# Patient Record
Sex: Female | Born: 1965 | Race: White | Hispanic: No | Marital: Married | Smoking: Never smoker
Health system: Southern US, Community
[De-identification: ages and names within clinical notes are randomized; demographics above are authoritative.]

## PROBLEM LIST (undated history)

## (undated) DIAGNOSIS — Z86711 Personal history of pulmonary embolism: Secondary | ICD-10-CM

## (undated) HISTORY — PX: TUBAL LIGATION: SHX77

## (undated) HISTORY — DX: Personal history of pulmonary embolism: Z86.711

---

## 1998-08-28 ENCOUNTER — Other Ambulatory Visit: Admission: RE | Admit: 1998-08-28 | Discharge: 1998-08-28 | Payer: Self-pay | Admitting: Obstetrics and Gynecology

## 1999-05-27 ENCOUNTER — Inpatient Hospital Stay (HOSPITAL_COMMUNITY): Admission: AD | Admit: 1999-05-27 | Discharge: 1999-05-29 | Payer: Self-pay | Admitting: Obstetrics and Gynecology

## 1999-06-24 ENCOUNTER — Other Ambulatory Visit: Admission: RE | Admit: 1999-06-24 | Discharge: 1999-06-24 | Payer: Self-pay | Admitting: Obstetrics and Gynecology

## 1999-11-05 ENCOUNTER — Encounter: Admission: RE | Admit: 1999-11-05 | Discharge: 2000-02-03 | Payer: Self-pay | Admitting: *Deleted

## 2000-02-04 ENCOUNTER — Encounter: Admission: RE | Admit: 2000-02-04 | Discharge: 2000-05-04 | Payer: Self-pay | Admitting: Obstetrics and Gynecology

## 2000-07-19 ENCOUNTER — Other Ambulatory Visit: Admission: RE | Admit: 2000-07-19 | Discharge: 2000-07-19 | Payer: Self-pay | Admitting: Obstetrics and Gynecology

## 2001-03-02 ENCOUNTER — Inpatient Hospital Stay (HOSPITAL_COMMUNITY): Admission: EM | Admit: 2001-03-02 | Discharge: 2001-03-06 | Payer: Self-pay

## 2001-03-02 ENCOUNTER — Encounter: Payer: Self-pay | Admitting: Internal Medicine

## 2001-03-02 ENCOUNTER — Encounter: Admission: RE | Admit: 2001-03-02 | Discharge: 2001-03-02 | Payer: Self-pay | Admitting: Internal Medicine

## 2001-09-18 ENCOUNTER — Other Ambulatory Visit: Admission: RE | Admit: 2001-09-18 | Discharge: 2001-09-18 | Payer: Self-pay | Admitting: Obstetrics and Gynecology

## 2002-05-21 ENCOUNTER — Other Ambulatory Visit: Admission: RE | Admit: 2002-05-21 | Discharge: 2002-05-21 | Payer: Self-pay | Admitting: Obstetrics and Gynecology

## 2002-12-07 ENCOUNTER — Inpatient Hospital Stay (HOSPITAL_COMMUNITY): Admission: AD | Admit: 2002-12-07 | Discharge: 2002-12-10 | Payer: Self-pay | Admitting: Obstetrics and Gynecology

## 2002-12-07 ENCOUNTER — Encounter (INDEPENDENT_AMBULATORY_CARE_PROVIDER_SITE_OTHER): Payer: Self-pay

## 2003-01-02 ENCOUNTER — Other Ambulatory Visit: Admission: RE | Admit: 2003-01-02 | Discharge: 2003-01-02 | Payer: Self-pay | Admitting: Obstetrics and Gynecology

## 2004-05-29 ENCOUNTER — Ambulatory Visit: Payer: Self-pay | Admitting: Oncology

## 2004-07-27 ENCOUNTER — Ambulatory Visit: Payer: Self-pay | Admitting: Oncology

## 2004-09-21 ENCOUNTER — Ambulatory Visit: Payer: Self-pay | Admitting: Oncology

## 2004-11-16 ENCOUNTER — Ambulatory Visit: Payer: Self-pay | Admitting: Oncology

## 2005-01-18 ENCOUNTER — Ambulatory Visit: Payer: Self-pay | Admitting: Oncology

## 2005-03-12 ENCOUNTER — Ambulatory Visit: Payer: Self-pay | Admitting: Oncology

## 2005-05-17 ENCOUNTER — Ambulatory Visit: Payer: Self-pay | Admitting: Oncology

## 2005-07-23 ENCOUNTER — Ambulatory Visit: Payer: Self-pay | Admitting: Oncology

## 2005-09-22 ENCOUNTER — Ambulatory Visit: Payer: Self-pay | Admitting: Oncology

## 2005-10-28 LAB — PROTIME-INR
INR: 2.2 (ref 2.00–3.50)
Protime: 18.1 Seconds — ABNORMAL HIGH (ref 10.6–13.4)

## 2005-11-23 ENCOUNTER — Ambulatory Visit: Payer: Self-pay | Admitting: Oncology

## 2005-12-29 ENCOUNTER — Ambulatory Visit: Payer: Self-pay | Admitting: Oncology

## 2006-01-25 LAB — LUPUS ANTICOAGULANT PANEL
PTTLA 4:1 Mix: 45.9 secs — ABNORMAL HIGH (ref 30.5–43.1)
PTTLA Confirmation: 1.6 secs (ref <8.0)

## 2006-01-25 LAB — BETA-2 GLYCOPROTEIN ANTIBODIES
Beta-2 Glyco I IgG: <4 U/mL (ref <20)
Beta-2-Glycoprotein I IgM: <4 U/mL (ref <10)

## 2006-01-25 LAB — CARDIOLIPIN ANTIBODIES, IGG, IGM, IGA
Anticardiolipin IgG: <7 [GPL'U] (ref <11)
Anticardiolipin IgM: 34 [MPL'U] (ref <10)

## 2006-01-31 LAB — PROTIME-INR
INR: 1.5 — ABNORMAL LOW (ref 2.00–3.50)
Protime: 15.2 Seconds — ABNORMAL HIGH (ref 10.6–13.4)

## 2006-02-07 LAB — PROTIME-INR
INR: 2 (ref 2.00–3.50)
Protime: 17.5 Seconds — ABNORMAL HIGH (ref 10.6–13.4)

## 2006-02-17 ENCOUNTER — Ambulatory Visit: Payer: Self-pay | Admitting: Oncology

## 2006-02-21 LAB — PROTIME-INR: INR: 1.6 — ABNORMAL LOW (ref 2.00–3.50)

## 2006-03-15 LAB — PROTIME-INR: Protime: 25.2 Seconds — ABNORMAL HIGH (ref 10.6–13.4)

## 2006-04-25 ENCOUNTER — Ambulatory Visit: Payer: Self-pay | Admitting: Oncology

## 2006-04-27 LAB — PROTIME-INR: INR: 1.9 — ABNORMAL LOW (ref 2.00–3.50)

## 2006-06-13 ENCOUNTER — Ambulatory Visit: Payer: Self-pay | Admitting: Oncology

## 2006-06-15 LAB — PROTIME-INR
INR: 1.5 — ABNORMAL LOW (ref 2.00–3.50)
Protime: 18 Seconds — ABNORMAL HIGH (ref 10.6–13.4)

## 2006-06-22 LAB — PROTIME-INR
INR: 1.7 — ABNORMAL LOW (ref 2.00–3.50)
Protime: 20.4 Seconds — ABNORMAL HIGH (ref 10.6–13.4)

## 2006-07-25 LAB — CBC WITH DIFFERENTIAL/PLATELET
BASO%: 1.3 % (ref 0.0–2.0)
Basophils Absolute: 0.1 10*3/uL (ref 0.0–0.1)
EOS%: 2.4 % (ref 0.0–7.0)
HCT: 42.1 % (ref 34.8–46.6)
HGB: 14.5 g/dL (ref 11.6–15.9)
LYMPH%: 26 % (ref 14.0–48.0)
MCH: 31.1 pg (ref 26.0–34.0)
MCHC: 34.3 g/dL (ref 32.0–36.0)
MCV: 90.7 fL (ref 81.0–101.0)
NEUT%: 64.2 % (ref 39.6–76.8)
Platelets: 368 10*3/uL (ref 145–400)
lymph#: 1.6 10*3/uL (ref 0.9–3.3)

## 2006-07-28 LAB — LUPUS ANTICOAGULANT PANEL
DRVVT 1:1 Mix: 40.5 secs (ref 31.9–44.2)
DRVVT: 74.9 secs — ABNORMAL HIGH (ref 31.9–44.2)
PTTLA 4:1 Mix: 59.9 secs — ABNORMAL HIGH (ref 36.3–48.8)
PTTLA Confirmation: 0 secs (ref <8.0)

## 2006-07-28 LAB — COMPREHENSIVE METABOLIC PANEL
ALT: 14 U/L (ref 0–35)
AST: 16 U/L (ref 0–37)
BUN: 7 mg/dL (ref 6–23)
Calcium: 8.8 mg/dL (ref 8.4–10.5)
Chloride: 104 mEq/L (ref 96–112)
Creatinine, Ser: 0.72 mg/dL (ref 0.40–1.20)
Total Bilirubin: 0.5 mg/dL (ref 0.3–1.2)

## 2006-07-28 LAB — BETA-2 GLYCOPROTEIN ANTIBODIES: Beta-2 Glyco I IgG: <4 U/mL (ref <20)

## 2006-07-28 LAB — LACTATE DEHYDROGENASE: LDH: 164 U/L (ref 94–250)

## 2006-07-28 LAB — CARDIOLIPIN ANTIBODIES, IGG, IGM, IGA
Anticardiolipin IgA: 7 [APL'U] (ref <13)
Anticardiolipin IgM: <7 [MPL'U] (ref <10)

## 2006-08-01 ENCOUNTER — Ambulatory Visit: Payer: Self-pay | Admitting: Oncology

## 2007-01-19 ENCOUNTER — Ambulatory Visit: Payer: Self-pay | Admitting: Oncology

## 2007-01-25 LAB — CBC WITH DIFFERENTIAL/PLATELET
EOS%: 3.4 % (ref 0.0–7.0)
LYMPH%: 27.9 % (ref 14.0–48.0)
MCH: 32.2 pg (ref 26.0–34.0)
MCHC: 35.5 g/dL (ref 32.0–36.0)
MCV: 90.7 fL (ref 81.0–101.0)
MONO%: 6.7 % (ref 0.0–13.0)
NEUT#: 3.6 10*3/uL (ref 1.5–6.5)
Platelets: 320 10*3/uL (ref 145–400)
RBC: 4.59 10*6/uL (ref 3.70–5.32)
RDW: 12.5 % (ref 11.3–14.5)

## 2007-01-27 LAB — PROTEIN S ACTIVITY: Protein S Activity: 103 % (ref 69–129)

## 2007-01-27 LAB — D-DIMER, QUANTITATIVE: D-Dimer, Quant: 0.57 ug/mL-FEU — ABNORMAL HIGH (ref 0.00–0.48)

## 2007-01-27 LAB — PROTEIN S, TOTAL: Protein S Ag, Total: 112 % (ref 70–140)

## 2007-07-26 ENCOUNTER — Ambulatory Visit: Payer: Self-pay | Admitting: Oncology

## 2007-07-31 LAB — CBC WITH DIFFERENTIAL/PLATELET
Basophils Absolute: 0 10*3/uL (ref 0.0–0.1)
EOS%: 3.4 % (ref 0.0–7.0)
HGB: 14.7 g/dL (ref 11.6–15.9)
LYMPH%: 27.5 % (ref 14.0–48.0)
MCH: 32.2 pg (ref 26.0–34.0)
MCV: 91.1 fL (ref 81.0–101.0)
MONO%: 5.6 % (ref 0.0–13.0)
Platelets: 361 10*3/uL (ref 145–400)
RDW: 12.6 % (ref 11.3–14.5)

## 2007-08-03 LAB — BETA-2 GLYCOPROTEIN ANTIBODIES: Beta-2 Glyco I IgG: 6 U/mL (ref <20)

## 2007-08-31 ENCOUNTER — Ambulatory Visit: Payer: Self-pay | Admitting: Surgery

## 2007-08-31 ENCOUNTER — Encounter: Payer: Self-pay | Admitting: Oncology

## 2007-08-31 ENCOUNTER — Ambulatory Visit: Admission: RE | Admit: 2007-08-31 | Discharge: 2007-08-31 | Payer: Self-pay | Admitting: Oncology

## 2007-08-31 LAB — CBC WITH DIFFERENTIAL/PLATELET
BASO%: 0.3 % (ref 0.0–2.0)
EOS%: 3.3 % (ref 0.0–7.0)
HCT: 40.9 % (ref 34.8–46.6)
HGB: 14.5 g/dL (ref 11.6–15.9)
MCH: 32.3 pg (ref 26.0–34.0)
MCHC: 35.4 g/dL (ref 32.0–36.0)
MONO#: 0.4 10*3/uL (ref 0.1–0.9)
RDW: 12.2 % (ref 11.3–14.5)
WBC: 5.4 10*3/uL (ref 3.9–10.0)
lymph#: 1.6 10*3/uL (ref 0.9–3.3)

## 2007-08-31 LAB — PROTIME-INR: Protime: 14.4 Seconds — ABNORMAL HIGH (ref 10.6–13.4)

## 2007-09-01 LAB — D-DIMER, QUANTITATIVE: D-Dimer, Quant: 1.73 ug/mL-FEU — ABNORMAL HIGH (ref 0.00–0.48)

## 2007-09-07 ENCOUNTER — Ambulatory Visit: Payer: Self-pay | Admitting: Oncology

## 2007-09-12 LAB — PROTIME-INR

## 2007-09-14 LAB — PROTIME-INR
INR: 2.8 (ref 2.00–3.50)
Protime: 33.6 Seconds — ABNORMAL HIGH (ref 10.6–13.4)

## 2007-09-19 LAB — PROTIME-INR
INR: 1.7 — ABNORMAL LOW (ref 2.00–3.50)
Protime: 20.4 Seconds — ABNORMAL HIGH (ref 10.6–13.4)

## 2007-09-26 LAB — PROTIME-INR: INR: 1.9 — ABNORMAL LOW (ref 2.00–3.50)

## 2007-10-10 LAB — PROTIME-INR: INR: 2.9 (ref 2.00–3.50)

## 2007-10-19 ENCOUNTER — Ambulatory Visit: Payer: Self-pay | Admitting: Oncology

## 2007-10-24 LAB — PROTIME-INR: Protime: 37.2 Seconds — ABNORMAL HIGH (ref 10.6–13.4)

## 2007-11-14 LAB — PROTIME-INR: INR: 2 (ref 2.00–3.50)

## 2007-12-07 ENCOUNTER — Ambulatory Visit: Payer: Self-pay | Admitting: Oncology

## 2007-12-12 LAB — PROTIME-INR
INR: 1.6 — ABNORMAL LOW (ref 2.00–3.50)
Protime: 19.2 Seconds — ABNORMAL HIGH (ref 10.6–13.4)

## 2007-12-27 LAB — PROTIME-INR: INR: 2.7 (ref 2.00–3.50)

## 2008-01-24 ENCOUNTER — Ambulatory Visit: Payer: Self-pay | Admitting: Oncology

## 2008-01-29 LAB — CBC WITH DIFFERENTIAL/PLATELET
BASO%: 0.7 % (ref 0.0–2.0)
LYMPH%: 25.9 % (ref 14.0–48.0)
MCHC: 35 g/dL (ref 32.0–36.0)
MONO#: 0.5 10*3/uL (ref 0.1–0.9)
MONO%: 8 % (ref 0.0–13.0)
Platelets: 301 10*3/uL (ref 145–400)
RBC: 4.43 10*6/uL (ref 3.70–5.32)
RDW: 12.5 % (ref 11.3–14.5)
WBC: 5.7 10*3/uL (ref 3.9–10.0)

## 2008-01-29 LAB — D-DIMER, QUANTITATIVE: D-Dimer, Quant: 0.22 ug/mL-FEU (ref 0.00–0.48)

## 2008-01-29 LAB — ERYTHROCYTE SEDIMENTATION RATE: Sed Rate: 21 mm/hr (ref 0–30)

## 2008-04-12 ENCOUNTER — Ambulatory Visit: Payer: Self-pay | Admitting: Oncology

## 2008-04-16 LAB — CBC WITH DIFFERENTIAL/PLATELET
BASO%: 1.8 % (ref 0.0–2.0)
LYMPH%: 27.7 % (ref 14.0–48.0)
MCHC: 34.7 g/dL (ref 32.0–36.0)
MONO#: 0.4 10*3/uL (ref 0.1–0.9)
NEUT#: 3.1 10*3/uL (ref 1.5–6.5)
Platelets: 319 10*3/uL (ref 145–400)
RBC: 4.7 10*6/uL (ref 3.70–5.32)
RDW: 12 % (ref 11.3–14.5)
WBC: 5.1 10*3/uL (ref 3.9–10.0)
lymph#: 1.4 10*3/uL (ref 0.9–3.3)

## 2008-04-16 LAB — PROTIME-INR
INR: 2.3 (ref 2.00–3.50)
Protime: 27.6 Seconds — ABNORMAL HIGH (ref 10.6–13.4)

## 2008-06-07 ENCOUNTER — Ambulatory Visit: Payer: Self-pay | Admitting: Oncology

## 2008-06-11 LAB — PROTIME-INR
INR: 3.2 (ref 2.00–3.50)
Protime: 38.4 Seconds — ABNORMAL HIGH (ref 10.6–13.4)

## 2008-08-02 ENCOUNTER — Ambulatory Visit: Payer: Self-pay | Admitting: Oncology

## 2008-09-27 ENCOUNTER — Ambulatory Visit: Payer: Self-pay | Admitting: Oncology

## 2008-10-01 LAB — PROTIME-INR

## 2008-10-07 ENCOUNTER — Encounter: Admission: RE | Admit: 2008-10-07 | Discharge: 2008-10-07 | Payer: Self-pay | Admitting: Obstetrics and Gynecology

## 2008-10-28 ENCOUNTER — Encounter: Payer: Self-pay | Admitting: Oncology

## 2008-10-28 ENCOUNTER — Ambulatory Visit: Admission: RE | Admit: 2008-10-28 | Discharge: 2008-10-28 | Payer: Self-pay | Admitting: Oncology

## 2008-10-28 ENCOUNTER — Ambulatory Visit: Payer: Self-pay | Admitting: *Deleted

## 2008-10-28 LAB — CBC WITH DIFFERENTIAL/PLATELET
BASO%: 0.6 % (ref 0.0–2.0)
Basophils Absolute: 0 10*3/uL (ref 0.0–0.1)
EOS%: 1.8 % (ref 0.0–7.0)
HGB: 13.6 g/dL (ref 11.6–15.9)
MCH: 32 pg (ref 25.1–34.0)
MCV: 91.7 fL (ref 79.5–101.0)
MONO%: 7.6 % (ref 0.0–14.0)
RBC: 4.25 10*6/uL (ref 3.70–5.45)
RDW: 13 % (ref 11.2–14.5)
lymph#: 1.7 10*3/uL (ref 0.9–3.3)

## 2008-10-28 LAB — PROTIME-INR
INR: 2.9 (ref 2.00–3.50)
Protime: 34.8 Seconds — ABNORMAL HIGH (ref 10.6–13.4)

## 2008-12-19 ENCOUNTER — Ambulatory Visit: Payer: Self-pay | Admitting: Oncology

## 2008-12-23 LAB — PROTIME-INR
INR: 2.8 (ref 2.00–3.50)
Protime: 33.6 Seconds — ABNORMAL HIGH (ref 10.6–13.4)

## 2009-01-21 ENCOUNTER — Ambulatory Visit: Payer: Self-pay | Admitting: Oncology

## 2009-02-17 LAB — PROTIME-INR
INR: 2.3 (ref 2.00–3.50)
Protime: 27.6 Seconds — ABNORMAL HIGH (ref 10.6–13.4)

## 2009-04-10 ENCOUNTER — Ambulatory Visit: Payer: Self-pay | Admitting: Oncology

## 2009-04-14 LAB — PROTIME-INR
INR: 2.2 (ref 2.00–3.50)
Protime: 26.4 Seconds — ABNORMAL HIGH (ref 10.6–13.4)

## 2009-06-06 ENCOUNTER — Ambulatory Visit: Payer: Self-pay | Admitting: Oncology

## 2009-06-10 LAB — CBC WITH DIFFERENTIAL/PLATELET
BASO%: 0.4 % (ref 0.0–2.0)
Eosinophils Absolute: 0.1 10*3/uL (ref 0.0–0.5)
MCHC: 34.5 g/dL (ref 31.5–36.0)
MONO#: 0.4 10*3/uL (ref 0.1–0.9)
NEUT#: 4.8 10*3/uL (ref 1.5–6.5)
RBC: 4.64 10*6/uL (ref 3.70–5.45)
WBC: 6.8 10*3/uL (ref 3.9–10.3)
lymph#: 1.4 10*3/uL (ref 0.9–3.3)

## 2009-06-10 LAB — PROTIME-INR: Protime: 42 Seconds — ABNORMAL HIGH (ref 10.6–13.4)

## 2009-08-01 ENCOUNTER — Ambulatory Visit: Payer: Self-pay | Admitting: Oncology

## 2009-08-05 LAB — PROTIME-INR: Protime: 18 Seconds — ABNORMAL HIGH (ref 10.6–13.4)

## 2009-10-02 ENCOUNTER — Ambulatory Visit: Payer: Self-pay | Admitting: Oncology

## 2009-10-08 LAB — CBC WITH DIFFERENTIAL/PLATELET
Basophils Absolute: 0 10*3/uL (ref 0.0–0.1)
EOS%: 4.8 % (ref 0.0–7.0)
HGB: 15 g/dL (ref 11.6–15.9)
LYMPH%: 30.3 % (ref 14.0–49.7)
MCH: 31.8 pg (ref 25.1–34.0)
MCV: 91.1 fL (ref 79.5–101.0)
MONO%: 11.8 % (ref 0.0–14.0)
RDW: 13.2 % (ref 11.2–14.5)

## 2009-10-08 LAB — PROTIME-INR
INR: 2.5 (ref 2.00–3.50)
Protime: 30 Seconds — ABNORMAL HIGH (ref 10.6–13.4)

## 2009-11-19 ENCOUNTER — Ambulatory Visit: Payer: Self-pay | Admitting: Oncology

## 2009-11-19 LAB — CBC WITH DIFFERENTIAL/PLATELET
BASO%: 0.3 % (ref 0.0–2.0)
EOS%: 2.7 % (ref 0.0–7.0)
MCH: 31.8 pg (ref 25.1–34.0)
MCHC: 34.5 g/dL (ref 31.5–36.0)
NEUT%: 74.6 % (ref 38.4–76.8)
RDW: 13.2 % (ref 11.2–14.5)
lymph#: 1.2 10*3/uL (ref 0.9–3.3)

## 2009-11-19 LAB — PROTIME-INR
INR: 3.1 (ref 2.00–3.50)
Protime: 37.2 Seconds — ABNORMAL HIGH (ref 10.6–13.4)

## 2009-12-10 ENCOUNTER — Ambulatory Visit: Payer: Self-pay | Admitting: Family Medicine

## 2009-12-10 DIAGNOSIS — L259 Unspecified contact dermatitis, unspecified cause: Secondary | ICD-10-CM

## 2010-01-09 ENCOUNTER — Ambulatory Visit: Payer: Self-pay | Admitting: Oncology

## 2010-01-15 ENCOUNTER — Ambulatory Visit: Payer: Self-pay | Admitting: Family

## 2010-01-15 ENCOUNTER — Telehealth: Payer: Self-pay | Admitting: Family

## 2010-01-15 ENCOUNTER — Ambulatory Visit (HOSPITAL_BASED_OUTPATIENT_CLINIC_OR_DEPARTMENT_OTHER): Admission: RE | Admit: 2010-01-15 | Discharge: 2010-01-15 | Payer: Self-pay | Admitting: Internal Medicine

## 2010-01-15 ENCOUNTER — Ambulatory Visit: Payer: Self-pay | Admitting: Diagnostic Radiology

## 2010-01-15 LAB — CONVERTED CEMR LAB
INR: 2.81 — ABNORMAL HIGH (ref <1.50)
Prothrombin Time: 29.4 s — ABNORMAL HIGH (ref 11.6–15.2)

## 2010-01-15 LAB — PROTIME-INR: INR: 3.1 (ref 2.00–3.50)

## 2010-01-26 ENCOUNTER — Ambulatory Visit: Payer: Self-pay | Admitting: Family

## 2010-01-26 DIAGNOSIS — D689 Coagulation defect, unspecified: Secondary | ICD-10-CM | POA: Insufficient documentation

## 2010-01-26 DIAGNOSIS — R609 Edema, unspecified: Secondary | ICD-10-CM | POA: Insufficient documentation

## 2010-01-26 LAB — CONVERTED CEMR LAB
INR: 1.69 — ABNORMAL HIGH (ref <1.50)
Prothrombin Time: 19.7 s — ABNORMAL HIGH (ref 11.6–15.2)

## 2010-01-29 ENCOUNTER — Telehealth: Payer: Self-pay | Admitting: Family

## 2010-01-30 ENCOUNTER — Ambulatory Visit (HOSPITAL_BASED_OUTPATIENT_CLINIC_OR_DEPARTMENT_OTHER): Admission: RE | Admit: 2010-01-30 | Discharge: 2010-01-30 | Payer: Self-pay | Admitting: Internal Medicine

## 2010-01-30 ENCOUNTER — Telehealth: Payer: Self-pay | Admitting: Internal Medicine

## 2010-01-30 ENCOUNTER — Ambulatory Visit: Payer: Self-pay | Admitting: Diagnostic Radiology

## 2010-02-02 LAB — CBC WITH DIFFERENTIAL/PLATELET
Basophils Absolute: 0.1 10*3/uL (ref 0.0–0.1)
Eosinophils Absolute: 0.4 10*3/uL (ref 0.0–0.5)
HGB: 14.3 g/dL (ref 11.6–15.9)
MCV: 90.8 fL (ref 79.5–101.0)
NEUT#: 3.1 10*3/uL (ref 1.5–6.5)
RDW: 13.1 % (ref 11.2–14.5)
lymph#: 1.6 10*3/uL (ref 0.9–3.3)

## 2010-02-02 LAB — PROTIME-INR: INR: 3.5 (ref 2.00–3.50)

## 2010-02-27 ENCOUNTER — Ambulatory Visit: Payer: Self-pay | Admitting: Oncology

## 2010-03-03 LAB — PROTIME-INR: Protime: 32.4 Seconds — ABNORMAL HIGH (ref 10.6–13.4)

## 2010-03-06 ENCOUNTER — Ambulatory Visit: Payer: Self-pay | Admitting: Family

## 2010-04-30 ENCOUNTER — Ambulatory Visit: Payer: Self-pay | Admitting: Oncology

## 2010-05-19 LAB — PROTIME-INR
INR: 2.4 (ref 2.00–3.50)
Protime: 28.8 Seconds — ABNORMAL HIGH (ref 10.6–13.4)

## 2010-06-01 ENCOUNTER — Telehealth: Payer: Self-pay | Admitting: Family

## 2010-06-01 ENCOUNTER — Encounter: Payer: Self-pay | Admitting: Family

## 2010-06-01 LAB — CONVERTED CEMR LAB
INR: 3.69 — ABNORMAL HIGH (ref <1.50)
Prothrombin Time: 36.6 s — ABNORMAL HIGH (ref 11.6–15.2)

## 2010-06-02 ENCOUNTER — Telehealth: Payer: Self-pay | Admitting: Family

## 2010-06-09 LAB — CONVERTED CEMR LAB: INR: 2.2 — ABNORMAL HIGH (ref <1.50)

## 2010-07-08 ENCOUNTER — Ambulatory Visit (HOSPITAL_BASED_OUTPATIENT_CLINIC_OR_DEPARTMENT_OTHER): Payer: PRIVATE HEALTH INSURANCE | Admitting: Oncology

## 2010-07-16 LAB — PROTIME-INR
INR: 2.2 (ref 2.00–3.50)
Protime: 26.4 Seconds — ABNORMAL HIGH (ref 10.6–13.4)

## 2010-08-13 NOTE — Assessment & Plan Note (Signed)
Summary: Rash - L lower leg, swelling x 2 dys rm 2   Vital Signs:  Patient Profile:   45 Years Old Female CC:      Rash x 2 dys  Height:     67 inches Weight:      187 pounds O2 Sat:      99 % O2 treatment:    Room Air Temp:     97.3 degrees F oral Pulse rate:   56 / minute Pulse rhythm:   regular Resp:     16 per minute BP sitting:   129 / 80  (right arm) Cuff size:   regular  Vitals Entered By: Areta Haber CMA (December 10, 2009 7:44 PM)                  Prior Medication List:  No prior medications documented  Current Allergies: No known allergies History of Present Illness Chief Complaint: Rash x 2 dys  History of Present Illness: Patient has rash on her lower L leg. The rash came about a month ago . She was put on prednisone for about a week. The rah did well initially and then it came back.   There ios no sweling in her arm but her L leg started swelling yestrday. She has seen her Oncologist in May and has had her PT checked then.  She states emphatically that this does not ffel like when she had blood clot in her leg.   Current Problems: CELLULITIS, LEFT LEG (ICD-682.6) CONTACT DERMATITIS&OTHER ECZEMA DUE UNSPEC CAUSE (ICD-692.9)   Current Meds COUMADIN 7.5 MG TABS (WARFARIN SODIUM) 1 tab by mouth once daily SEPTRA DS 800-160 MG TABS (SULFAMETHOXAZOLE-TRIMETHOPRIM) 1 by mouth twice daily BACTROBAN 2 % OINT (MUPIROCIN) apply to leg 3x a dy for 10 days PREDNISONE (PAK) 10 MG TABS (PREDNISONE) sig  6 pills by mouth day 1&2, 5 pills by mouth day 3&4, 4 pills day 5&6, 3 pills by mouth day 7&8, 2 pills by mouth day 9 &10, 1 pill by mouth day 11&12  REVIEW OF SYSTEMS Constitutional Symptoms      Denies fever, chills, night sweats, weight loss, weight gain, and fatigue.  Eyes       Denies change in vision, eye pain, eye discharge, glasses, contact lenses, and eye surgery. Ear/Nose/Throat/Mouth       Denies hearing loss/aids, change in hearing, ear pain, ear  discharge, dizziness, frequent runny nose, frequent nose bleeds, sinus problems, sore throat, hoarseness, and tooth pain or bleeding.  Respiratory       Denies dry cough, productive cough, wheezing, shortness of breath, asthma, bronchitis, and emphysema/COPD.  Cardiovascular       Denies murmurs, chest pain, and tires easily with exhertion.    Gastrointestinal       Denies stomach pain, nausea/vomiting, diarrhea, constipation, blood in bowel movements, and indigestion. Genitourniary       Denies painful urination, kidney stones, and loss of urinary control. Neurological       Denies paralysis, seizures, and fainting/blackouts. Musculoskeletal       Complains of swelling.      Denies muscle pain, joint pain, joint stiffness, decreased range of motion, redness, muscle weakness, and gout.  Skin       Denies bruising, unusual mles/lumps or sores, and hair/skin or nail changes.      Comments: L lower leg x 2 dys Psych       Denies mood changes, temper/anger issues, anxiety/stress, speech problems, depression, and sleep  problems. Other Comments: arms and leg itch   Past History:  Family History: Last updated: 12/10/2009 Family History High cholesterol  Social History: Last updated: 12/10/2009 Married Never Smoked Alcohol use-yes - 4 drinks weekly Drug use-no Regular exercise-no  Risk Factors: Exercise: no (12/10/2009)  Risk Factors: Smoking Status: never (12/10/2009)  Past Medical History: Antiphospholid syndrome Blood CLot  Past Surgical History: Caesarean section Bunion removal  Family History: Reviewed history and no changes required. Family History High cholesterol  Social History: Reviewed history and no changes required. Married Never Smoked Alcohol use-yes - 4 drinks weekly Drug use-no Regular exercise-no Smoking Status:  never Drug Use:  no Does Patient Exercise:  no Physical Exam General appearance: well developed, well nourished, mild   distress Head: normocephalic, atraumatic Extremities: Rash w/ marcked exoriations on both arms and L leg  Skin: non specific rash on arms  MSE: oriented to time, place, and person Assessment New Problems: CELLULITIS, LEFT LEG (ICD-682.6) CONTACT DERMATITIS&OTHER ECZEMA DUE UNSPEC CAUSE (ICD-692.9)  cellulitis  and  edema contact dermatitis  Patient Education: Patient and/or caregiver instructed in the following: rest fluids and Tylenol.  Plan New Medications/Changes: PREDNISONE (PAK) 10 MG TABS (PREDNISONE) sig  6 pills by mouth day 1&2, 5 pills by mouth day 3&4, 4 pills day 5&6, 3 pills by mouth day 7&8, 2 pills by mouth day 9 &10, 1 pill by mouth day 11&12  #42 x 0, 12/10/2009, Hassan Rowan MD BACTROBAN 2 % OINT (MUPIROCIN) apply to leg 3x a dy for 10 days  #1 tube x 0, 12/10/2009, Hassan Rowan MD SEPTRA DS 800-160 MG TABS (SULFAMETHOXAZOLE-TRIMETHOPRIM) 1 by mouth twice daily  #20 x 0, 12/10/2009, Hassan Rowan MD  New Orders: New Patient Level IV [56213] Solumedrol up to 125mg  [J2930] Rocephin  250mg  [J0696] Admin of Therapeutic Inj  intramuscular or subcutaneous [96372] Follow Up: Follow up in 2-3 days if no improvement, Follow up with Primary Physician Work/School Excuse: Return to work/school in 3 days  The patient and/or caregiver has been counseled thoroughly with regard to medications prescribed including dosage, schedule, interactions, rationale for use, and possible side effects and they verbalize understanding.  Diagnoses and expected course of recovery discussed and will return if not improved as expected or if the condition worsens. Patient and/or caregiver verbalized understanding.  Prescriptions: PREDNISONE (PAK) 10 MG TABS (PREDNISONE) sig  6 pills by mouth day 1&2, 5 pills by mouth day 3&4, 4 pills day 5&6, 3 pills by mouth day 7&8, 2 pills by mouth day 9 &10, 1 pill by mouth day 11&12  #42 x 0   Entered and Authorized by:   Hassan Rowan MD   Signed by:   Hassan Rowan  MD on 12/10/2009   Method used:   Print then Give to Patient   RxID:   [id number] BACTROBAN 2 % OINT (MUPIROCIN) apply to leg 3x a dy for 10 days  #1 tube x 0   Entered and Authorized by:   Hassan Rowan MD   Signed by:   Hassan Rowan MD on 12/10/2009   Method used:   Print then Give to Patient   RxID:   [id number] SEPTRA DS 800-160 MG TABS (SULFAMETHOXAZOLE-TRIMETHOPRIM) 1 by mouth twice daily  #20 x 0   Entered and Authorized by:   Hassan Rowan MD   Signed by:   Hassan Rowan MD on 12/10/2009   Method used:   Print then Give to Patient   RxID:   [id number]   Patient Instructions:  1)  Please schedule an appointment with your primary doctor in : 2)   follow-up appointment in 2-5 days 3)  Recommended remaining out of work for nxt 2 days 4)  Take your antibiotic as prescribed until ALL of it is gone, but stop if you develop a rash or swelling and contact our office as soon as possible. 5)  Claritin 10 mg 1 by mouth q day may take a zyrtec at night if needed 6)  Bed rest and elevation of L leg  7)  Follow up w/dermatologist is worse may need to considerpediculosis infection.  Medication Administration  Injection # 1:    Medication: Solumedrol up to 125mg     Diagnosis: CELLULITIS, LEFT LEG (ICD-682.6)    Route: IM    Site: LUOQ gluteus    Exp Date: 06/10/2012    Lot #: Marilynne Drivers    Mfr: Pharmacia    Comments: Administered 125mg     Patient tolerated injection without complications    Given by: Areta Haber CMA (December 10, 2009 8:50 PM)  Injection # 2:    Medication: Rocephin  250mg     Diagnosis: CELLULITIS, LEFT LEG (ICD-682.6)    Route: IM    Site: RUOQ gluteus    Exp Date: 07/11/2012    Lot #: [id number]    Mfr: Sandox    Comments: Administered 1 gram    Patient tolerated injection without complications    Given by: Areta Haber CMA (December 10, 2009 8:52 PM)  Orders Added: 1)  New Patient Level IV [99204] 2)  Solumedrol up to 125mg  [J2930] 3)  Rocephin   250mg  [J0696] 4)  Admin of Therapeutic Inj  intramuscular or subcutaneous [96372]   Medication Administration  Injection # 1:    Medication: Solumedrol up to 125mg     Diagnosis: CELLULITIS, LEFT LEG (ICD-682.6)    Route: IM    Site: LUOQ gluteus    Exp Date: 06/10/2012    Lot #: Marilynne Drivers    Mfr: Pharmacia    Comments: Administered 125mg     Patient tolerated injection without complications    Given by: Areta Haber CMA (December 10, 2009 8:50 PM)  Injection # 2:    Medication: Rocephin  250mg     Diagnosis: CELLULITIS, LEFT LEG (ICD-682.6)    Route: IM    Site: RUOQ gluteus    Exp Date: 07/11/2012    Lot #: [id number]    Mfr: Sandox    Comments: Administered 1 gram    Patient tolerated injection without complications    Given by: Areta Haber CMA (December 10, 2009 8:52 PM)  Orders Added: 1)  New Patient Level IV [99204] 2)  Solumedrol up to 125mg  [J2930] 3)  Rocephin  250mg  [J0696] 4)  Admin of Therapeutic Inj  intramuscular or subcutaneous [81191]

## 2010-08-13 NOTE — Progress Notes (Signed)
Summary: PT/INR-  [facility]  Phone Note From Other Clinic   Caller: [facility] Call For: Sarah Harrington  Summary of Call: patients pt is 29.4 and inr is 2.81  Initial call taken by: Roselle Locus,  January 15, 2010 1:36 PM  Follow-up for Phone Call        Norval Gable From [facility] called [phone]), and stated patient was seen and had her INR drawn there, to avoid this in the future, she states that patient had informed her that she is scheduled to follow up on the 18th of July for medication check.  Eileen Stanford was informed a follow up appointment was not on file for patient. She would like to know if the patient does return on the 18th if she could have her PT/INR drawn at her appointment and faxed to them when completed at [phone] Follow-up by: Glendell Docker CMA,  January 15, 2010 1:43 PM

## 2010-08-13 NOTE — Assessment & Plan Note (Signed)
Summary:  Rash on Left Lower Leg & Ankles are swollen- jr- Rm 4   Vital Signs:  Patient profile:   45 year old female Height:      67 inches Weight:      189.75 pounds BMI:     29.83 Temp:     98.7 degrees F oral Pulse rate:   62 / minute Pulse rhythm:   regular Resp:     18 per minute BP sitting:   112 / 80  (right arm) Cuff size:   large  Vitals Entered By: [name] CMA [name]) (January 15, 2010 9:57 AM) CC: Room 4  Rash & swelling on left lower leg.  Swelling is better with rest.  States she was seen by urgent care 1 month ago and treated with abx. Symptoms improved but have returned 2 weeks ago. Pt has history of blood clot but states that her PT/INR was normal 3 weeks ago. Is Patient Diabetic? No Comments Pt has completed Bactroban and Prednisone.   CC:  Room 4  Rash & swelling on left lower leg.  Swelling is better with rest.  States she was seen by urgent care 1 month ago and treated with abx. Symptoms improved but have returned 2 weeks ago. Pt has history of blood clot but states that her PT/INR was normal 3 weeks ago.[name]  History of Present Illness: Sarah Harrington is a 45 year old female who presents today with complaint of rash and swelling of the left leg which started on easter weekend.  She was treated with a predpeck with some improvement.  Then symptoms worsened and she was treated by the urgent care center again with antibiotics and another round of steroids.  Symptoms improved- then came back.    Patient with historty of LLE DVT and PE 2002.  She notes that she was briefly off of coumadin several years later,   and had recurrent DVT.  She has + history of antiphospholipid syndrome- Dr. Cyndie Chime manages coumadin.   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 Cyndie Chime  Past Surgical History: Caesarean section x 2 1997 and 2004 Bunion removal Adenoidectomy age 67  Review of Systems       Denies shortness of breath, fever of calf pain.   Physical Exam  General:  Well-developed,well-nourished,in no acute distress; alert,appropriate and cooperative throughout examination Head:  Normocephalic and atraumatic without obvious abnormalities. No apparent alopecia or balding. Lungs:  Normal respiratory effort, chest expands symmetrically. Lungs are clear to auscultation, no crackles or wheezes. Heart:  Normal rate and regular rhythm. S1 and S2 normal without gallop, murmur, click, rub or other extra sounds. Extremities:  +swelling of LLE up to knee, + erythema noted on left shin   Impression & Recommendations:  Problem # 1:  CELLULITIS, LEFT LEG (ICD-682.6) INR therapeutic, doppler negative for DVT.  Will treat with Keflex for cellulitis.  Recommended f/u on Monday of next week- pt tells me she will be out of town until the following Monday.  Instructed patient to seek medical attention if worsening pain, swelling, redness or if fever.  She verbalized understanding and plans  to f/u the first day upon her return.  We will recheck PT/INR that day. Orders: INR/PT-[facility] ([address]) Misc. Referral (Misc. Ref)  Her updated medication list for this problem includes:    Keflex 500 Mg Caps (Cephalexin) .[name]... 2 tabs by mouth two times a day x 7 days  Complete Medication List: 1)  Coumadin 7.5 Mg Tabs (Warfarin sodium) .[name].. 1 tab by mouth once daily 2)  Keflex 500 Mg Caps (Cephalexin) .... 2 tabs by mouth two times a day x 7 days  Patient Instructions: 1)  Please follow up as soon as you return from your vacation. 2)  If you  develop increased redness, swelling, pain or fever of the left leg while you are on vacation, please seek medical attention. Prescriptions: KEFLEX 500 MG CAPS (CEPHALEXIN) 2 tabs by mouth two times a day x 7 days  #14 x 0   Entered and Authorized by:   Lemont Fillers FNP   Signed by:   Lemont Fillers FNP on 01/15/2010   Method used:   Electronically to        [facility]  [address] [phone]* (retail)       [address]       [place], [place]  [address]       Ph: [phone]       Fax: [phone]   RxID:   [id number]   Current Allergies (reviewed today): No known allergies

## 2010-08-13 NOTE — Progress Notes (Signed)
  Phone Note Outgoing Call   Summary of Call: Called patient, reviewed INR 3.6, recommended that she hold coumadin x 2 nights, then resume current dose of 7.5mg  daily. F/u PT/INR on Monday11/21.  Pt reports that her throat is feeling much better today. Initial call taken by: Lemont Fillers FNP,  June 02, 2010 8:49 AM

## 2010-08-13 NOTE — Assessment & Plan Note (Signed)
Summary: 1 MONTH FOLLOW UP/[facility] per pt/dt--Rm 5   Vital Signs:  Patient profile:   45 year old female Height:      67 inches Weight:      188.50 pounds BMI:     29.63 Temp:     98.1 degrees F oral Pulse rate:   66 / minute Pulse rhythm:   regular Resp:     16 per minute BP sitting:   106 / 70  (right arm) Cuff size:   k  Vitals Entered By: [name] CMA [name]) (March 06, 2010 8:27 AM) CC: Room 5  1 month follow up. Is Patient Diabetic? No   CC:  Room 5  1 month follow up.[name]  History of Present Illness: Sarah Harrington is a 45 year old female who presents today for follow up of her LE swelling.  She has a history of LLE DVT.   She has had a LLE doppler whichwas negative for DVT as well as a CT abdomen and pelvis which was negative.  Last visit she was given and Rx for support hose.  Pt has been using support hose with some improvement in LLE swelling.  Notes that she still has a rash on her left shin.   Pt has been seeing dermatology who has prescribed a steroid cream and told her that the rash is likely due to dermatitis from chronic venous insufficiency.  Allergies (verified): No Known Drug Allergies  Past History:  Past Medical History: Last updated: 01/15/2010 Antiphospholid syndrome DVT x2 LLE (first in 2002) PE 2002- coumadin monitored by Dr. Cyndie Chime  Past Surgical History: Last updated: 01/15/2010 Caesarean section x 2 1997 and 2004 Bunion removal Adenoidectomy age 82  Physical Exam  General:  Well-developed,well-nourished,in no acute distress; alert,appropriate and cooperative throughout examination Lungs:  Normal respiratory effort, chest expands symmetrically. Lungs are clear to auscultation, no crackles or wheezes. Heart:  Normal rate and regular rhythm. S1 and S2 normal without gallop, murmur, click, rub or other extra sounds. Extremities:  + red rash noted on left anterior shin.  Mild swelling of LLE but improved since last  visit.   Impression & Recommendations:  Problem # 1:  EDEMA (ICD-782.3) Assessment Improved Continue support hose and fluocinonide as prescribed by dermatology.  Improving.  Plan for complete physical next visit.  Complete Medication List: 1)  Coumadin 7.5 Mg Tabs (Warfarin sodium) .[name].. 1 tab by mouth once daily 2)  Jobst Support Hose Thigh High 20-29mmhg  .... Wear as directed 3)  Aspirin 81 Mg Tabs (Aspirin) .... Take 1 tablet by mouth once a day 4)  Fluocinonide 0.05 % Oint (Fluocinonide) .... Apply to affected area twice daily x 2 weeks each month.  Patient Instructions: 1)  Please arrange a follow up appointment for a complete physical.  Come fasting to this appointment.   Current Allergies (reviewed today): No known allergies R

## 2010-08-13 NOTE — Progress Notes (Signed)
Summary: Need clarification on quantity for Rx  Phone Note From Pharmacy Call back at [phone]   Caller: [facility] Request: Resend Prescription Summary of Call: Pls call [facility] at [facility], the quantity is wrong on Rx, pharmacy states pt is on her way in  Initial call taken by: Lannette Donath,  January 15, 2010 11:36 AM  Follow-up for Phone Call        called pharmacy- confirmed #28 tabs of keflex Follow-up by: Lemont Fillers FNP,  January 15, 2010 12:27 PM

## 2010-08-13 NOTE — Progress Notes (Signed)
  Phone Note Outgoing Call   Summary of Call: Case reviewed with Dr. Artist Pais.  Will plan to check CT of the abdomen and pelvis to rule out any abdominal mass or lympho-obstructive process.  Pt also notes that the coumadin clinic did call her to adjust her coumadin as INR was subtherapeutic. Initial call taken by: Lemont Fillers FNP,  January 29, 2010 9:33 AM

## 2010-08-13 NOTE — Assessment & Plan Note (Signed)
Summary: fu from 7/7 appt & lab for ptinr/dt--Rm 5   Vital Signs:  Patient profile:   45 year old female Height:      67 inches Weight:      194.75 pounds BMI:     30.61 Temp:     98.1 degrees F oral Pulse rate:   60 / minute Pulse rhythm:   regular Resp:     18 per minute BP sitting:   108 / 76  (right arm) Cuff size:   large  Vitals Entered By: [name] CMA [name]) (January 26, 2010 8:38 AM) CC: Room 5   Left foot still swollen and rash not improved.  Also needs PT / INR. Is Patient Diabetic? No Comments Pt still has a few Keflex left. States she had only been taking 1 two times a day instead of 2 two times a day. [name] CMA [name])  January 26, 2010 8:42 AM    CC:  Room 5   Left foot still swollen and rash not improved.  Also needs PT / INR.[name]  History of Present Illness: Sarah Harrington is a 45 year old female who presents for folow up of her LLE edema.  Notes that the edema has not improved despite treatment with antibiotics.  LLE doppler performed last visit was negative for DVT.  She denies pain in the left foot or leg.  Denies fever.  She follows with Dr. Cyndie Chime for antiphospholipid syndrome and is on chronic antiocoagulation with coumadin.   Allergies (verified): No Known Drug Allergies  Past History:  Past Medical History: Last updated: 01/15/2010 Antiphospholid syndrome DVT x2 LLE (first in 2002) PE 2002- coumadin monitored by Dr. Cyndie Chime  Past Surgical History: Last updated: 01/15/2010 Caesarean section x 2 1997 and 2004 Bunion removal Adenoidectomy age 29  Physical Exam  General:  Well-developed,well-nourished,in no acute distress; alert,appropriate and cooperative throughout examination Head:  Normocephalic and atraumatic without obvious abnormalities. No apparent alopecia or balding. Pulses:  2+ left DP pulse Extremities:  + swelling of left foot and left lower leg up to knee.  No tenderness Skin:  some erythema noted on left anterior  shin without significant warmth.   Impression & Recommendations:  Problem # 1:  EDEMA (ICD-782.3) Assessment Unchanged Doubt cellulitis at this point.  Will give patient a trial of compression hose and see if this helps.  Plan f/u in 1 month.   Problem # 2:  OTHER AND UNSPECIFIED COAGULATION DEFECTS (ICD-286.9) Assessment: Unchanged On chronic coumadin.  Will plan to check follow up PT/INR due to recent abx and will fax to [facility] clinic.  Complete Medication List: 1)  Coumadin 7.5 Mg Tabs (Warfarin sodium) .[name].. 1 tab by mouth once daily 2)  Keflex 500 Mg Caps (Cephalexin) .... 2 tabs by mouth two times a day x 7 days 3)  Jobst Support Hose Thigh High 20-75mmhg  .... Wear as directed  Other Orders: T-Protime, Auto ([id number])  Patient Instructions: 1)  Please schedule your follow up mammogram.  2)  Bring rx to [facility] for your support hose.  Put them on first thing in the morning and remove at bedtime. 3)  Follow up in 1 month. Prescriptions: JOBST SUPPORT HOSE THIGH HIGH 20-30MMHG wear as directed  #1 x 1   Entered and Authorized by:   Lemont Fillers FNP   Signed by:   Lemont Fillers FNP on 01/26/2010   Method used:   Print then Give to Patient   RxID:  [id number]   Current Allergies (reviewed today): No known allergies

## 2010-08-13 NOTE — Letter (Signed)
[facility]   Imported By: Lanelle Bal 06/10/2010 11:42:04  _____________________________________________________________________  External Attachment:    Type:   Image     Comment:   External Document

## 2010-08-13 NOTE — Letter (Signed)
Summary: Fax from Patient Regarding Strep Rx  Fax from Patient Regarding Strep Rx   Imported By: Lanelle Bal 06/10/2010 11:42:57  _____________________________________________________________________  External Attachment:    Type:   Image     Comment:   External Document

## 2010-08-13 NOTE — Progress Notes (Signed)
Summary: strep  Phone Note Call from Patient Call back at [phone]   Caller: Patient Call For: Lemont Fillers FNP Summary of Call: Received call from pt stating she went to the [facility] and tested positive for strep. Pharmacy could not prescribe her an antibiotic because she is taking coumadin. Requested pt fax result to [place].  Per Efraim Kaufmann, she will be able to treat once we receive the result. [name] CMA [name])  June 01, 2010 11:48 AM   Follow-up for Phone Call        Notes received and forwarded to Provider for review. [name] CMA [name])  June 01, 2010 1:51 PM   Additional Follow-up for Phone Call Additional follow up Details #1::        Please ask patient to come to the lab for PT/INR today, and then again on Monday 11/28.  Rx was sent to her pharmacy. Additional Follow-up by: Lemont Fillers FNP,  June 01, 2010 2:01 PM    Additional Follow-up for Phone Call Additional follow up Details #2::    Pt notified and voices understanding. Orders sent to the lab x 2. [name] CMA [name])  June 01, 2010 2:14 PM   New/Updated Medications: AMOXICILLIN 500 MG CAPS (AMOXICILLIN) one cap by mouth three times a day for 10 days Prescriptions: AMOXICILLIN 500 MG CAPS (AMOXICILLIN) one cap by mouth three times a day for 10 days  #30 x 0   Entered and Authorized by:   Lemont Fillers FNP   Signed by:   Lemont Fillers FNP on 06/01/2010   Method used:   Electronically to        [facility]  [address] [phone]* (retail)       [address]       [place], [place]  [address]       Ph: [phone]       Fax: [phone]   RxID:   [id number]

## 2010-08-13 NOTE — Progress Notes (Signed)
Summary: advised patient ct negative for mass   Phone Note Outgoing Call   Summary of Call: call pt - ct of abd and pelvis negative for mass Initial call taken by: D. Thomos Lemons DO,  January 30, 2010 1:14 PM  Follow-up for Phone Call        advised patient ct negative for mass  Sarah Harrington  January 30, 2010 2:16 PM

## 2010-10-05 ENCOUNTER — Encounter (HOSPITAL_BASED_OUTPATIENT_CLINIC_OR_DEPARTMENT_OTHER): Payer: PRIVATE HEALTH INSURANCE | Admitting: Oncology

## 2010-10-05 DIAGNOSIS — Z86718 Personal history of other venous thrombosis and embolism: Secondary | ICD-10-CM

## 2010-10-05 DIAGNOSIS — D6859 Other primary thrombophilia: Secondary | ICD-10-CM

## 2010-10-05 DIAGNOSIS — Z7901 Long term (current) use of anticoagulants: Secondary | ICD-10-CM

## 2010-10-05 LAB — CBC WITH DIFFERENTIAL/PLATELET
BASO%: 0.6 % (ref 0.0–2.0)
Basophils Absolute: 0 10*3/uL (ref 0.0–0.1)
EOS%: 3.5 % (ref 0.0–7.0)
HCT: 39.8 % (ref 34.8–46.6)
HGB: 13.7 g/dL (ref 11.6–15.9)
MCH: 30.9 pg (ref 25.1–34.0)
MCHC: 34.4 g/dL (ref 31.5–36.0)
MCV: 89.8 fL (ref 79.5–101.0)
MONO%: 8.1 % (ref 0.0–14.0)
NEUT%: 63 % (ref 38.4–76.8)
lymph#: 1.2 10*3/uL (ref 0.9–3.3)

## 2010-10-05 LAB — PROTIME-INR: INR: 2.2 (ref 2.00–3.50)

## 2010-11-27 NOTE — H&P (Signed)
   NAME:  Sarah Harrington, Sarah Harrington                      ACCOUNT NO.:  [id number]   MEDICAL RECORD NO.:  [id number]                   PATIENT TYPE:  INP   LOCATION:  NA                                   FACILITY:  [facility]   PHYSICIAN:  Lenoard Aden, M.D.             DATE OF BIRTH:  Oct 03, 1965   DATE OF ADMISSION:  DATE OF DISCHARGE:                                HISTORY & PHYSICAL   CHIEF COMPLAINT:  Elective repeat C-section.   HISTORY OF PRESENT ILLNESS:  The patient is a 45 year old white female, G3,  P2, EDD December 20, 2002, at 38 weeks for elective repeat C-section. History is  antiphospholipid antibody screen on Lovenox; discontinuation of baby aspirin  and Lovenox for repeat C-section.   ALLERGIES:  The patient has no known drug allergies.   MEDICATIONS:  1. Prenatal vitamins.  2. Lovenox.   PAST OBSTETRICAL HISTORY:  Primary C-section 1997.  History of successful  VBAC with shoulder dystocia in 2000.  History of antiphospholipid antibody  syndrome diagnosed after unexplained pulmonary embolism.   FAMILY HISTORY:  Myocardial infarction, hypertension and prostate cancer.   PAST SURGICAL HISTORY:  The patient has a surgical history remarkable for C-  section, tonsillectomy and bunionectomy.   PRENATAL LABORATORY DATA:  Revealed a blood type of A positive, Rh antibody  negative.  Rubella immune.  Hepatitis B surface antigen and HIV are  nonreactive.   Pregnancy uncomplicated.  Previously on Lovenox and baby aspirin, now status  post discontinuation.   PHYSICAL EXAMINATION:  GENERAL:  The patient is a well-developed, well-  nourished white female in no acute distress.  HEENT:  Normal.  LUNGS:  Clear.  HEART:  Regular rhythm.  ABDOMEN:  Soft.  Gravid and nontender.  Estimated fetal weight 8.5 pounds.  PELVIC EXAMINATION:  Cervix is closed, 2 cm, dilated, vertex, and -2.   IMPRESSION:  1. Thirty-eight-week obstetrical.  2. Antiphospholipid antibody syndrome with reassuring  surveillance for     repeat cesarean section and tubal ligation.   PLAN:  Proceed with elective repeat C-section.   The risks of anesthesia, infection, bleeding, injury to abdominal organs and  need for repair were discussed with the patient and she wishes to proceed.                                                 Lenoard Aden, M.D.    RJT/MEDQ  D:  12/06/2002  T:  12/07/2002  Job:  [id number]

## 2010-11-27 NOTE — Discharge Summary (Signed)
   NAME:  ALAISA, Sarah Harrington                      ACCOUNT NO.:  [id number]   MEDICAL RECORD NO.:  [id number]                   PATIENT TYPE:  INP   LOCATION:  9145                                 FACILITY:  [facility]   PHYSICIAN:  Lenoard Aden, M.D.             DATE OF BIRTH:  07-31-1965   DATE OF ADMISSION:  12/07/2002  DATE OF DISCHARGE:  12/10/2002                                 DISCHARGE SUMMARY   HOSPITAL COURSE:  The patient underwent uncomplicated primary C-section on  Dec 07, 2002, for previous C-section, elective tubal sterilization.  Postoperative hemoglobin was within normal limits going from 11.1 to 10.3.  Her postoperative course was uncomplicated.  There was no evidence of  gingival bleeding or increased bruising, vaginal bleeding well controlled.  She was restarted on her Lovenox and then Coumadin concomitantly. She had  good pain relief.  She was discharged to home on postoperative day #3.   DISCHARGE MEDICATIONS:  Coumadin, Lovenox, Percocet and prenatal vitamins.   FOLLOW UP:  She is to follow up with hematology within 24 hours for  monitoring of her PT.  Otherwise, she is to follow up in the office in three  to four weeks.  Staples are removed.  Teaching is done.                                               Lenoard Aden, M.D.    RJT/MEDQ  D:  12/10/2002  T:  12/10/2002  Job:  [id number]

## 2010-11-27 NOTE — Op Note (Signed)
NAME:  Sarah Harrington, FUSILIER                      ACCOUNT NO.:  [id number]   MEDICAL RECORD NO.:  [id number]                   PATIENT TYPE:  INP   LOCATION:  9198                                 FACILITY:  [facility]   PHYSICIAN:  Lenoard Aden, M.D.             DATE OF BIRTH:  02/10/1966   DATE OF PROCEDURE:  DATE OF DISCHARGE:                                 OPERATIVE REPORT   PREOPERATIVE DIAGNOSES:  1. At 38 weeks antiphospholipid antibody syndrome on Lovenox.  2. Previous cesarean section.  3. Desire for elective sterilization.   POSTOPERATIVE DIAGNOSES:  1. At 38 weeks antiphospholipid antibody syndrome on Lovenox.  2. Previous cesarean section.  3. Desire for elective sterilization.   PROCEDURE:  Repeat low segment transverse cesarean section and bilateral  tubal ligation.   SURGEON:  Lenoard Aden, M.D.   ASSISTANT:  Chester Holstein. Earlene Plater, M.D.   ANESTHESIA:  Spinal anesthesia by Burnett Corrente, M.D.   ESTIMATED BLOOD LOSS:  1000 mL.   COMPLICATIONS:  None.   DRAINS:  Foley catheter.   COUNTS:  Correct.   DISPOSITION:  The patient was taken to the recovery room in good condition.   FINDINGS:  Full term living female, 8 pounds 10 ounces, Apgars 8 and 9.  Placenta posterior intact.  Three vessel cord, normal tubes, normal ovaries.  Tubal segment to pathology.   DESCRIPTION OF PROCEDURE:  After being apprised of the risks of anesthesia,  infection, and bleeding, injury to abdominal organs and need for repair,  failure risk of tubal ligation 5 to 04/999; the patient was taken tot he  operating room where she was administered a spinal anesthetic without  complications.  She was prepped and draped in the usual sterile fashion.  Foley catheter placed.  After achieving adequate anesthesia, dilute Marcaine  solution placed in the area of previous Pfannenstiel skin incision was then  made with the scalpel, carried down to the fascia, nicked in the midline and  opened  transversely with Mayo scissors.  Rectus muscles dissected sharply in  the midline.  Peritoneum entered sharply.  Bladder blade place.  Visceral  peritoneum was scored in a smiling fashion.  Uterus scored in smiling  fashion.  Amniotomy revealed clear fluid.  Atraumatic delivery of full-term  living female, 8 pounds 10 ounces, handed to pediatricians in attendance.  Apgars were 8 and 9.  Cord blood collected.  Placenta delivered manually  intact.  Three vessel cord noted.  Uterus exteriorized and closed in two  layers using 0 Monocryl suture.  Right tube traced out to the fimbriated  end.  Ampullary ischemic portion is cauterized in an avascular portion of  the mesosalpinx and the tube was tied proximally and distally.  Segment  removed.  Same procedure was done on the left tube.  Good hemostasis noted.  Tubal lumens are cauterized and inspected.  Uterine incision inspected.  All  are found to be  hemostatic.  Uterus replaced in the abdominal cavity.  Pericolic gutters irrigated.  All blood clots subsequently removed.  Good  hemostasis achieved.  Bladder flap inspected.  Fascia then closed using 0  Monocryl in continuous running fashion.  Skin closed using staples.  Pressure dressing placed.  The patient tolerated the procedure well and is  taken to recovery room in good condition.                                               Lenoard Aden, M.D.    RJT/MEDQ  D:  12/07/2002  T:  12/07/2002  Job:  [id number]

## 2010-12-02 ENCOUNTER — Encounter (HOSPITAL_BASED_OUTPATIENT_CLINIC_OR_DEPARTMENT_OTHER): Payer: PRIVATE HEALTH INSURANCE | Admitting: Oncology

## 2010-12-02 ENCOUNTER — Other Ambulatory Visit: Payer: Self-pay | Admitting: Oncology

## 2010-12-02 DIAGNOSIS — D6859 Other primary thrombophilia: Secondary | ICD-10-CM

## 2010-12-02 DIAGNOSIS — Z7901 Long term (current) use of anticoagulants: Secondary | ICD-10-CM

## 2010-12-02 DIAGNOSIS — Z86718 Personal history of other venous thrombosis and embolism: Secondary | ICD-10-CM

## 2010-12-02 LAB — PROTIME-INR: INR: 2.3 (ref 2.00–3.50)

## 2011-01-28 ENCOUNTER — Encounter (HOSPITAL_BASED_OUTPATIENT_CLINIC_OR_DEPARTMENT_OTHER): Payer: PRIVATE HEALTH INSURANCE | Admitting: Oncology

## 2011-01-28 ENCOUNTER — Other Ambulatory Visit: Payer: Self-pay | Admitting: Oncology

## 2011-01-28 DIAGNOSIS — Z7901 Long term (current) use of anticoagulants: Secondary | ICD-10-CM

## 2011-01-28 DIAGNOSIS — Z86718 Personal history of other venous thrombosis and embolism: Secondary | ICD-10-CM

## 2011-01-28 DIAGNOSIS — D6859 Other primary thrombophilia: Secondary | ICD-10-CM

## 2011-01-28 LAB — LACTATE DEHYDROGENASE: LDH: 195 U/L (ref 94–250)

## 2011-01-28 LAB — COMPREHENSIVE METABOLIC PANEL
ALT: 14 U/L (ref 0–35)
Alkaline Phosphatase: 30 U/L — ABNORMAL LOW (ref 39–117)
Chloride: 104 mEq/L (ref 96–112)
Creatinine, Ser: 0.81 mg/dL (ref 0.50–1.10)
Glucose, Bld: 100 mg/dL — ABNORMAL HIGH (ref 70–99)
Potassium: 4.5 mEq/L (ref 3.5–5.3)
Total Bilirubin: 0.4 mg/dL (ref 0.3–1.2)
Total Protein: 6.5 g/dL (ref 6.0–8.3)

## 2011-01-28 LAB — CBC WITH DIFFERENTIAL/PLATELET
Basophils Absolute: 0 10*3/uL (ref 0.0–0.1)
EOS%: 4.6 % (ref 0.0–7.0)
Eosinophils Absolute: 0.2 10*3/uL (ref 0.0–0.5)
HGB: 14.1 g/dL (ref 11.6–15.9)
MCH: 31.8 pg (ref 25.1–34.0)
NEUT#: 3.2 10*3/uL (ref 1.5–6.5)
RDW: 12.9 % (ref 11.2–14.5)
WBC: 5.2 10*3/uL (ref 3.9–10.3)
lymph#: 1.2 10*3/uL (ref 0.9–3.3)

## 2011-01-28 LAB — PROTIME-INR: INR: 2.9 (ref 2.00–3.50)

## 2011-02-08 ENCOUNTER — Encounter (HOSPITAL_BASED_OUTPATIENT_CLINIC_OR_DEPARTMENT_OTHER): Payer: PRIVATE HEALTH INSURANCE | Admitting: Oncology

## 2011-02-08 DIAGNOSIS — Z7901 Long term (current) use of anticoagulants: Secondary | ICD-10-CM

## 2011-02-08 DIAGNOSIS — D6859 Other primary thrombophilia: Secondary | ICD-10-CM

## 2011-02-08 DIAGNOSIS — Z86718 Personal history of other venous thrombosis and embolism: Secondary | ICD-10-CM

## 2011-03-30 ENCOUNTER — Other Ambulatory Visit: Payer: Self-pay | Admitting: Dermatology

## 2011-05-18 ENCOUNTER — Ambulatory Visit (HOSPITAL_BASED_OUTPATIENT_CLINIC_OR_DEPARTMENT_OTHER): Payer: Commercial Managed Care - PPO | Admitting: Pharmacist

## 2011-05-18 ENCOUNTER — Other Ambulatory Visit (HOSPITAL_BASED_OUTPATIENT_CLINIC_OR_DEPARTMENT_OTHER): Payer: PRIVATE HEALTH INSURANCE | Admitting: Lab

## 2011-05-18 ENCOUNTER — Encounter: Payer: PRIVATE HEALTH INSURANCE | Admitting: Oncology

## 2011-05-18 ENCOUNTER — Other Ambulatory Visit: Payer: Self-pay | Admitting: Oncology

## 2011-05-18 DIAGNOSIS — D6861 Antiphospholipid syndrome: Secondary | ICD-10-CM

## 2011-05-18 DIAGNOSIS — I82409 Acute embolism and thrombosis of unspecified deep veins of unspecified lower extremity: Secondary | ICD-10-CM

## 2011-05-18 DIAGNOSIS — Z7901 Long term (current) use of anticoagulants: Secondary | ICD-10-CM

## 2011-05-18 DIAGNOSIS — Z86718 Personal history of other venous thrombosis and embolism: Secondary | ICD-10-CM

## 2011-05-18 DIAGNOSIS — D6859 Other primary thrombophilia: Secondary | ICD-10-CM

## 2011-05-18 NOTE — Progress Notes (Signed)
This encounter would not allow editing in the anticoag track.  Another encounter had to be created to complete her visit.  Please disregard this encounter.

## 2011-05-24 ENCOUNTER — Ambulatory Visit: Payer: PRIVATE HEALTH INSURANCE

## 2011-05-24 ENCOUNTER — Other Ambulatory Visit: Payer: PRIVATE HEALTH INSURANCE | Admitting: Lab

## 2011-06-08 ENCOUNTER — Encounter: Payer: Self-pay | Admitting: *Deleted

## 2011-06-08 NOTE — Progress Notes (Signed)
Received fax from [facility] stating pt received flu vaccine 04/28/11.  Sent to be scanned.

## 2011-06-14 ENCOUNTER — Other Ambulatory Visit: Payer: Self-pay

## 2011-06-14 ENCOUNTER — Other Ambulatory Visit: Payer: Self-pay | Admitting: Oncology

## 2011-06-14 DIAGNOSIS — D6859 Other primary thrombophilia: Secondary | ICD-10-CM

## 2011-06-14 MED ORDER — WARFARIN SODIUM 7.5 MG PO TABS: 7.5000 mg | ORAL_TABLET | Freq: Every day | ORAL | Status: DC

## 2011-06-14 NOTE — Telephone Encounter (Signed)
Patient requested that pharmacy call in another refill for her Coumadin 7.5mg  tablets.  At her last visit to the pharmacy, the refill authorization had not been processed yet by the Doctor's office.   Called in to her [facility] pharmacy, but found the refill authorization had just been faxed by Dr. Patsy Lager office.  No further action needed.

## 2011-06-15 ENCOUNTER — Other Ambulatory Visit: Payer: PRIVATE HEALTH INSURANCE | Admitting: Lab

## 2011-06-15 ENCOUNTER — Ambulatory Visit: Payer: PRIVATE HEALTH INSURANCE

## 2011-06-18 ENCOUNTER — Other Ambulatory Visit: Payer: Self-pay | Admitting: Pharmacist

## 2011-06-18 DIAGNOSIS — I82409 Acute embolism and thrombosis of unspecified deep veins of unspecified lower extremity: Secondary | ICD-10-CM

## 2011-06-18 DIAGNOSIS — D6861 Antiphospholipid syndrome: Secondary | ICD-10-CM | POA: Insufficient documentation

## 2011-06-21 ENCOUNTER — Ambulatory Visit (HOSPITAL_BASED_OUTPATIENT_CLINIC_OR_DEPARTMENT_OTHER): Payer: Self-pay | Admitting: Pharmacist

## 2011-06-21 ENCOUNTER — Ambulatory Visit: Payer: PRIVATE HEALTH INSURANCE

## 2011-06-21 ENCOUNTER — Other Ambulatory Visit (HOSPITAL_BASED_OUTPATIENT_CLINIC_OR_DEPARTMENT_OTHER): Payer: PRIVATE HEALTH INSURANCE | Admitting: Lab

## 2011-06-21 DIAGNOSIS — D6859 Other primary thrombophilia: Secondary | ICD-10-CM

## 2011-06-21 DIAGNOSIS — I82409 Acute embolism and thrombosis of unspecified deep veins of unspecified lower extremity: Secondary | ICD-10-CM

## 2011-06-21 DIAGNOSIS — D6861 Antiphospholipid syndrome: Secondary | ICD-10-CM

## 2011-06-21 LAB — POCT INR: INR: 2.3

## 2011-06-21 LAB — PROTIME-INR
INR: 2.3 (ref 2.00–3.50)
Protime: 27.6 Seconds — ABNORMAL HIGH (ref 10.6–13.4)

## 2011-06-21 NOTE — Progress Notes (Signed)
Pt doing well.  No complaints. Return in 2 months as she is stable. Marily Lente, Pharm.D.

## 2011-08-16 ENCOUNTER — Other Ambulatory Visit: Payer: PRIVATE HEALTH INSURANCE | Admitting: Lab

## 2011-08-16 ENCOUNTER — Ambulatory Visit: Payer: PRIVATE HEALTH INSURANCE

## 2011-08-16 ENCOUNTER — Telehealth: Payer: Self-pay | Admitting: Oncology

## 2011-08-16 NOTE — Telephone Encounter (Signed)
Pt came today and left, called pt back and transferred call to [facility], PharmD.

## 2011-08-18 ENCOUNTER — Other Ambulatory Visit (HOSPITAL_BASED_OUTPATIENT_CLINIC_OR_DEPARTMENT_OTHER): Payer: PRIVATE HEALTH INSURANCE | Admitting: Lab

## 2011-08-18 ENCOUNTER — Ambulatory Visit: Payer: PRIVATE HEALTH INSURANCE

## 2011-08-18 DIAGNOSIS — D6861 Antiphospholipid syndrome: Secondary | ICD-10-CM

## 2011-08-18 DIAGNOSIS — I82409 Acute embolism and thrombosis of unspecified deep veins of unspecified lower extremity: Secondary | ICD-10-CM

## 2011-08-18 DIAGNOSIS — D6859 Other primary thrombophilia: Secondary | ICD-10-CM

## 2011-08-18 LAB — PROTIME-INR
INR: 3 (ref 2.00–3.50)
Protime: 36 Seconds — ABNORMAL HIGH (ref 10.6–13.4)

## 2011-08-18 NOTE — Progress Notes (Unsigned)
No change to dose.  Continue 7.5 mg daily. Return on 10/13/11 at 8:45 am for lab & 9am for Coumadin clinic.

## 2011-08-18 NOTE — Patient Instructions (Signed)
No change to dose.  Continue 7.5 mg daily. Return on 10/13/11 at 8:45 am for lab & 9am for Coumadin clinic. 

## 2011-08-23 ENCOUNTER — Other Ambulatory Visit: Payer: Self-pay | Admitting: Pharmacist

## 2011-08-23 DIAGNOSIS — D6861 Antiphospholipid syndrome: Secondary | ICD-10-CM

## 2011-08-23 DIAGNOSIS — I82409 Acute embolism and thrombosis of unspecified deep veins of unspecified lower extremity: Secondary | ICD-10-CM

## 2011-10-13 ENCOUNTER — Ambulatory Visit: Payer: PRIVATE HEALTH INSURANCE

## 2011-10-13 ENCOUNTER — Other Ambulatory Visit: Payer: PRIVATE HEALTH INSURANCE

## 2011-10-13 ENCOUNTER — Telehealth: Payer: Self-pay | Admitting: Pharmacist

## 2011-10-14 ENCOUNTER — Other Ambulatory Visit (HOSPITAL_BASED_OUTPATIENT_CLINIC_OR_DEPARTMENT_OTHER): Payer: PRIVATE HEALTH INSURANCE | Admitting: Lab

## 2011-10-14 ENCOUNTER — Ambulatory Visit (HOSPITAL_BASED_OUTPATIENT_CLINIC_OR_DEPARTMENT_OTHER): Payer: PRIVATE HEALTH INSURANCE | Admitting: Pharmacist

## 2011-10-14 DIAGNOSIS — Z5181 Encounter for therapeutic drug level monitoring: Secondary | ICD-10-CM

## 2011-10-14 DIAGNOSIS — Z7901 Long term (current) use of anticoagulants: Secondary | ICD-10-CM

## 2011-10-14 DIAGNOSIS — D6861 Antiphospholipid syndrome: Secondary | ICD-10-CM

## 2011-10-14 DIAGNOSIS — I82409 Acute embolism and thrombosis of unspecified deep veins of unspecified lower extremity: Secondary | ICD-10-CM

## 2011-10-14 DIAGNOSIS — D6859 Other primary thrombophilia: Secondary | ICD-10-CM

## 2011-10-14 MED ORDER — WARFARIN SODIUM 7.5 MG PO TABS: 7.5000 mg | ORAL_TABLET | Freq: Every day | ORAL | Status: DC

## 2011-10-14 NOTE — Progress Notes (Signed)
INR may be slightly below goal due to increased Vitamin K intake over Easter holiday weekend. Continue 7.5mg  daily. Recheck INR in 6 weeks. 11/25/11, lab = 9am, coumadin clinic = 9:30am.

## 2011-10-14 NOTE — Patient Instructions (Addendum)
INR may be slightly below goal due to increased Vitamin K intake over Easter holiday weekend. Continue 7.5mg  daily. Recheck INR in 6 weeks. 11/25/11, lab = 9am, coumadin clinic = 9:30am. Your warfarin refill has been called to the [facility] in [place].

## 2011-11-25 ENCOUNTER — Other Ambulatory Visit (HOSPITAL_BASED_OUTPATIENT_CLINIC_OR_DEPARTMENT_OTHER): Payer: Commercial Managed Care - PPO

## 2011-11-25 ENCOUNTER — Ambulatory Visit (HOSPITAL_BASED_OUTPATIENT_CLINIC_OR_DEPARTMENT_OTHER): Payer: Commercial Managed Care - PPO | Admitting: Pharmacist

## 2011-11-25 DIAGNOSIS — I82409 Acute embolism and thrombosis of unspecified deep veins of unspecified lower extremity: Secondary | ICD-10-CM

## 2011-11-25 DIAGNOSIS — D6859 Other primary thrombophilia: Secondary | ICD-10-CM

## 2011-11-25 DIAGNOSIS — D6861 Antiphospholipid syndrome: Secondary | ICD-10-CM

## 2011-11-25 LAB — PROTIME-INR
INR: 3.6 — ABNORMAL HIGH (ref 2.00–3.50)
Protime: 43.2 Seconds — ABNORMAL HIGH (ref 10.6–13.4)

## 2011-11-25 LAB — POCT INR: INR: 3.6

## 2011-11-25 NOTE — Progress Notes (Signed)
No bruising. No bleeding noted. No problems to report. Pt knows to call us with any bleeding or unusual bruising or any concerns. INR slightly higher than usual. Nothing concerning. This is possibly due to taking an extra dose of Coumadin yesterday per patient report.  Pt has been stable on this dose.  Will continue the same dose; 7.5mg  daily.  We will recheck her INR in 4 weeks (instead of 8 weeks) to monitor INR trend.

## 2011-11-25 NOTE — Patient Instructions (Signed)
Continue 7.5mg  daily. Recheck INR in 4 weeks. Monitor for any unusal bruising or bleeding events. Call us with any problems or concerns.

## 2011-11-29 ENCOUNTER — Telehealth: Payer: Self-pay | Admitting: Pharmacist

## 2011-11-29 NOTE — Telephone Encounter (Signed)
Received communication from Dr. Cyndie Chime. He would like patient to return on 5/23 or 5/24 to recheck INR. I left a VM on pt cell phone asking her to call us to set up the appointment. 5/23 = 9:15am or 5/24 = 9am are available.

## 2011-12-02 ENCOUNTER — Ambulatory Visit (HOSPITAL_BASED_OUTPATIENT_CLINIC_OR_DEPARTMENT_OTHER): Payer: Commercial Managed Care - PPO | Admitting: Pharmacist

## 2011-12-02 ENCOUNTER — Other Ambulatory Visit (HOSPITAL_BASED_OUTPATIENT_CLINIC_OR_DEPARTMENT_OTHER): Payer: Commercial Managed Care - PPO | Admitting: Lab

## 2011-12-02 DIAGNOSIS — D6861 Antiphospholipid syndrome: Secondary | ICD-10-CM

## 2011-12-02 DIAGNOSIS — I82409 Acute embolism and thrombosis of unspecified deep veins of unspecified lower extremity: Secondary | ICD-10-CM

## 2011-12-02 DIAGNOSIS — D6859 Other primary thrombophilia: Secondary | ICD-10-CM

## 2011-12-02 LAB — PROTIME-INR: Protime: 36 Seconds — ABNORMAL HIGH (ref 10.6–13.4)

## 2011-12-02 NOTE — Progress Notes (Signed)
No changes.  Continue current Coumadin dose 7.5mg  daily.  Check PT/INR in 1 month.

## 2011-12-23 ENCOUNTER — Ambulatory Visit: Payer: PRIVATE HEALTH INSURANCE

## 2011-12-23 ENCOUNTER — Other Ambulatory Visit: Payer: PRIVATE HEALTH INSURANCE | Admitting: Lab

## 2012-01-04 ENCOUNTER — Other Ambulatory Visit (HOSPITAL_BASED_OUTPATIENT_CLINIC_OR_DEPARTMENT_OTHER): Payer: Commercial Managed Care - PPO | Admitting: Lab

## 2012-01-04 ENCOUNTER — Ambulatory Visit (HOSPITAL_BASED_OUTPATIENT_CLINIC_OR_DEPARTMENT_OTHER): Payer: Commercial Managed Care - PPO | Admitting: Pharmacist

## 2012-01-04 DIAGNOSIS — D6859 Other primary thrombophilia: Secondary | ICD-10-CM

## 2012-01-04 DIAGNOSIS — I82409 Acute embolism and thrombosis of unspecified deep veins of unspecified lower extremity: Secondary | ICD-10-CM

## 2012-01-04 DIAGNOSIS — Z5181 Encounter for therapeutic drug level monitoring: Secondary | ICD-10-CM

## 2012-01-04 DIAGNOSIS — D6861 Antiphospholipid syndrome: Secondary | ICD-10-CM

## 2012-01-04 DIAGNOSIS — Z7901 Long term (current) use of anticoagulants: Secondary | ICD-10-CM

## 2012-01-04 LAB — PROTIME-INR
INR: 3.1 (ref 2.00–3.50)
Protime: 37.2 Seconds — ABNORMAL HIGH (ref 10.6–13.4)

## 2012-01-04 NOTE — Progress Notes (Signed)
INR = 3.1 on 7.5 mg/day.  This has been a stable dose for her. No complaints or concerns re: anticoag. INR at goal so we'll continue the same dose. Return 02/07/12 (same day as MD appt). Marily Lente, Pharm.D.

## 2012-01-07 ENCOUNTER — Telehealth: Payer: Self-pay | Admitting: Oncology

## 2012-01-07 NOTE — Telephone Encounter (Signed)
Talked to pt and she is aware of appt on 02/11/12 reschedule from 02/07/12

## 2012-01-20 ENCOUNTER — Other Ambulatory Visit: Payer: PRIVATE HEALTH INSURANCE | Admitting: Lab

## 2012-02-07 ENCOUNTER — Ambulatory Visit: Payer: PRIVATE HEALTH INSURANCE | Admitting: Oncology

## 2012-02-07 ENCOUNTER — Other Ambulatory Visit: Payer: Commercial Managed Care - PPO | Admitting: Lab

## 2012-02-07 ENCOUNTER — Ambulatory Visit: Payer: Commercial Managed Care - PPO

## 2012-02-11 ENCOUNTER — Ambulatory Visit (HOSPITAL_BASED_OUTPATIENT_CLINIC_OR_DEPARTMENT_OTHER): Payer: Commercial Managed Care - PPO | Admitting: Oncology

## 2012-02-11 ENCOUNTER — Other Ambulatory Visit (HOSPITAL_BASED_OUTPATIENT_CLINIC_OR_DEPARTMENT_OTHER): Payer: Commercial Managed Care - PPO | Admitting: Lab

## 2012-02-11 ENCOUNTER — Ambulatory Visit: Payer: Commercial Managed Care - PPO | Admitting: Pharmacist

## 2012-02-11 ENCOUNTER — Telehealth: Payer: Self-pay | Admitting: Oncology

## 2012-02-11 VITALS — BP 120/73 | HR 54 | Temp 97.6°F | Resp 20 | Ht 67.0 in | Wt 201.8 lb

## 2012-02-11 DIAGNOSIS — Z7901 Long term (current) use of anticoagulants: Secondary | ICD-10-CM

## 2012-02-11 DIAGNOSIS — D6861 Antiphospholipid syndrome: Secondary | ICD-10-CM

## 2012-02-11 DIAGNOSIS — Z86718 Personal history of other venous thrombosis and embolism: Secondary | ICD-10-CM

## 2012-02-11 DIAGNOSIS — I82409 Acute embolism and thrombosis of unspecified deep veins of unspecified lower extremity: Secondary | ICD-10-CM

## 2012-02-11 DIAGNOSIS — D689 Coagulation defect, unspecified: Secondary | ICD-10-CM

## 2012-02-11 DIAGNOSIS — D6859 Other primary thrombophilia: Secondary | ICD-10-CM

## 2012-02-11 DIAGNOSIS — Z86711 Personal history of pulmonary embolism: Secondary | ICD-10-CM

## 2012-02-11 DIAGNOSIS — I2699 Other pulmonary embolism without acute cor pulmonale: Secondary | ICD-10-CM

## 2012-02-11 LAB — CBC WITH DIFFERENTIAL/PLATELET
Basophils Absolute: 0.1 10*3/uL (ref 0.0–0.1)
Eosinophils Absolute: 0.2 10*3/uL (ref 0.0–0.5)
HGB: 15.1 g/dL (ref 11.6–15.9)
LYMPH%: 25 % (ref 14.0–49.7)
MCV: 89.2 fL (ref 79.5–101.0)
MONO%: 8.8 % (ref 0.0–14.0)
NEUT#: 3.3 10*3/uL (ref 1.5–6.5)
Platelets: 253 10*3/uL (ref 145–400)

## 2012-02-11 LAB — PROTIME-INR: Protime: 37.2 Seconds — ABNORMAL HIGH (ref 10.6–13.4)

## 2012-02-11 MED ORDER — WARFARIN SODIUM 7.5 MG PO TABS: 7.5000 mg | ORAL_TABLET | Freq: Every day | ORAL | Status: DC

## 2012-02-11 NOTE — Progress Notes (Signed)
CC:   Lenoard Aden, M.D.  Followup visit for this pleasant 46 year old, diagnosed with antiphospholipid antibody syndrome subsequent to an unprovoked pulmonary embolus at age 7, 11 years ago in August, 2002, with concomitant left lower extremity DVT.  She has been on full-dose Coumadin anticoagulation since that time and has had no recurrent thrombotic events.  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  Fortunately, this area has not ulcerated.  She has had no interim medical problems.  She denies any dyspnea, chest pain or palpitations.  No leg swelling or pain.  PHYSICAL EXAMINATION:  Well-nourished, Caucasian woman.  Blood pressure 120/73, pulse 54 and regular, respirations 20, temp 97.6, weight 202 pounds, up from 188 last year;  5 feet 7 inches tall.  Head and neck: Normal.  Lungs:  Clear and resonant to percussion.  Regular cardiac rhythm.  No murmur.  Abdomen:  Soft, nontender, no mass, no organomegaly.  Extremities:  No edema.  No calf tenderness. Patchy area about 3 x 3 cm of hypertrophied skin, mid tibial region left leg but no ulceration.  Neurologic:  With motor strength 5/5.  Reflexes 1+, symmetric.  LABORATORY DATA:  Hemoglobin 15, hematocrit 43, white count 5400, platelets 252,000, protime 37.2 seconds.  INR 3.1 on Coumadin 7.5 mg daily.  IMPRESSION: 1. 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 2. Unprovoked pulmonary embolus and left lower extremity DVT August,     2002, secondary to number 1.  PLAN:  Continue long-term Coumadin anticoagulation.    ______________________________ Levert Feinstein, M.D., F.A.C.P. JMG/MEDQ  D:  02/11/2012  T:  02/11/2012  Job:  [id number]

## 2012-02-11 NOTE — Telephone Encounter (Signed)
appts made and printed for pt aom °

## 2012-02-11 NOTE — Progress Notes (Signed)
Patient returns for coumadin clinic visit. INR slightly above goal at 3.1 but has been stable on this dose. Plan: No change to dose.  Continue 7.5 mg daily. Return on 03/23/12 at 8:30 am for lab & 8:45 am for Coumadin clinic

## 2012-02-13 ENCOUNTER — Encounter: Payer: Self-pay | Admitting: Oncology

## 2012-02-13 DIAGNOSIS — Z86711 Personal history of pulmonary embolism: Secondary | ICD-10-CM

## 2012-02-13 HISTORY — DX: Personal history of pulmonary embolism: Z86.711

## 2012-03-23 ENCOUNTER — Other Ambulatory Visit: Payer: Commercial Managed Care - PPO | Admitting: Lab

## 2012-03-23 ENCOUNTER — Ambulatory Visit (HOSPITAL_BASED_OUTPATIENT_CLINIC_OR_DEPARTMENT_OTHER): Payer: Commercial Managed Care - PPO | Admitting: Pharmacist

## 2012-03-23 DIAGNOSIS — I82409 Acute embolism and thrombosis of unspecified deep veins of unspecified lower extremity: Secondary | ICD-10-CM

## 2012-03-23 DIAGNOSIS — D6861 Antiphospholipid syndrome: Secondary | ICD-10-CM

## 2012-03-23 DIAGNOSIS — D6859 Other primary thrombophilia: Secondary | ICD-10-CM

## 2012-03-23 LAB — POCT INR: INR: 2.6

## 2012-03-23 LAB — PROTIME-INR

## 2012-03-23 NOTE — Patient Instructions (Signed)
No change to dose.  Continue 7.5 mg daily. Return on 05/18/12 at 8:30 am for lab & 8:45 am for Coumadin clinic.

## 2012-03-23 NOTE — Progress Notes (Signed)
Continue 7.5 mg daily. Return on 05/18/12 at 8:30 am for lab & 8:45 am for Coumadin clinic.

## 2012-05-18 ENCOUNTER — Ambulatory Visit: Payer: Commercial Managed Care - PPO

## 2012-05-18 ENCOUNTER — Other Ambulatory Visit: Payer: Commercial Managed Care - PPO | Admitting: Lab

## 2012-05-25 ENCOUNTER — Ambulatory Visit: Payer: Commercial Managed Care - PPO

## 2012-05-25 ENCOUNTER — Telehealth: Payer: Self-pay | Admitting: Pharmacist

## 2012-05-25 ENCOUNTER — Other Ambulatory Visit: Payer: Commercial Managed Care - PPO | Admitting: Lab

## 2012-05-25 NOTE — Telephone Encounter (Signed)
LVM to call back and reschedule coumadin clinic appmt

## 2012-06-07 ENCOUNTER — Ambulatory Visit (HOSPITAL_BASED_OUTPATIENT_CLINIC_OR_DEPARTMENT_OTHER): Payer: Commercial Managed Care - PPO | Admitting: Pharmacist

## 2012-06-07 ENCOUNTER — Ambulatory Visit (HOSPITAL_BASED_OUTPATIENT_CLINIC_OR_DEPARTMENT_OTHER): Payer: Commercial Managed Care - PPO | Admitting: Lab

## 2012-06-07 ENCOUNTER — Ambulatory Visit: Payer: Self-pay | Admitting: Pharmacist

## 2012-06-07 DIAGNOSIS — I82409 Acute embolism and thrombosis of unspecified deep veins of unspecified lower extremity: Secondary | ICD-10-CM

## 2012-06-07 DIAGNOSIS — D6859 Other primary thrombophilia: Secondary | ICD-10-CM

## 2012-06-07 DIAGNOSIS — D6861 Antiphospholipid syndrome: Secondary | ICD-10-CM

## 2012-06-07 LAB — PROTIME-INR: Protime: 38.4 Seconds — ABNORMAL HIGH (ref 10.6–13.4)

## 2012-06-07 NOTE — Progress Notes (Signed)
Pt's INR is slightly supratherapeutic today at 3.2. Her INR was within range at 2.6 on this same regimen during her last visit. She doesn't admit to any bleeding/bruising, change in diet or medication other than eating 1 small serving of spinach this past weekend. I will continue her on her current regimen of 7.5mg  daily and schedule her to come back in 2 weeks on Thursday, December 12 at 8:30am

## 2012-06-22 ENCOUNTER — Other Ambulatory Visit (HOSPITAL_BASED_OUTPATIENT_CLINIC_OR_DEPARTMENT_OTHER): Payer: Commercial Managed Care - PPO | Admitting: Lab

## 2012-06-22 ENCOUNTER — Ambulatory Visit (HOSPITAL_BASED_OUTPATIENT_CLINIC_OR_DEPARTMENT_OTHER): Payer: Commercial Managed Care - PPO | Admitting: Pharmacist

## 2012-06-22 DIAGNOSIS — D6859 Other primary thrombophilia: Secondary | ICD-10-CM

## 2012-06-22 DIAGNOSIS — D6861 Antiphospholipid syndrome: Secondary | ICD-10-CM

## 2012-06-22 DIAGNOSIS — I82409 Acute embolism and thrombosis of unspecified deep veins of unspecified lower extremity: Secondary | ICD-10-CM

## 2012-06-22 DIAGNOSIS — Z7901 Long term (current) use of anticoagulants: Secondary | ICD-10-CM

## 2012-06-22 LAB — PROTIME-INR

## 2012-06-22 NOTE — Progress Notes (Signed)
Will change dose slightly to 5 mg on M, W, F and 7.5 mg on other days.  She will start this tomorrow (Fri) since she has taken todays dose. Return in 1 week on 06/30/12 at 11:30 am for lab & 11:45 am for Coumadin.  Gave patient Coumadin 5 mg #10 samples to cover dose change through next INR.  May need to write new script to get 5mg  tabs to cover her new regimen if it stays with some 5mg  days.  Coumadin 71m samples Lot#[id number] EXP 6/14.

## 2012-06-22 NOTE — Patient Instructions (Addendum)
Will change dose slightly to 5 mg on M, W, F and 7.5 mg on other days.  She will start this tomorrow (Fri) since she has taken todays dose. Return in 1 week on 06/30/12 at 11:30 am for lab & 11:45 am for Coumadin.

## 2012-06-30 ENCOUNTER — Ambulatory Visit (HOSPITAL_BASED_OUTPATIENT_CLINIC_OR_DEPARTMENT_OTHER): Payer: Commercial Managed Care - PPO | Admitting: Pharmacist

## 2012-06-30 ENCOUNTER — Other Ambulatory Visit (HOSPITAL_BASED_OUTPATIENT_CLINIC_OR_DEPARTMENT_OTHER): Payer: Commercial Managed Care - PPO | Admitting: Lab

## 2012-06-30 DIAGNOSIS — D6861 Antiphospholipid syndrome: Secondary | ICD-10-CM

## 2012-06-30 DIAGNOSIS — D6859 Other primary thrombophilia: Secondary | ICD-10-CM

## 2012-06-30 DIAGNOSIS — I82409 Acute embolism and thrombosis of unspecified deep veins of unspecified lower extremity: Secondary | ICD-10-CM

## 2012-06-30 LAB — PROTIME-INR: INR: 4.4 — ABNORMAL HIGH (ref 2.00–3.50)

## 2012-06-30 NOTE — Patient Instructions (Signed)
Will hold dose today and decrease weekly dose to 5 mg daily and 7.5 mg on M,W,F.  Return to clinic when she travels back in town on 07/11/12 at 8:15 am for lab & 8:30 am for Coumadin clinic.

## 2012-06-30 NOTE — Progress Notes (Signed)
Pt supratherapeutic today at 4.4 after holding and changing weekly dose.  Will hold dose today and decrease weekly dose to 5 mg daily and 7.5 mg on M,W,F.  Return to clinic when she travels back in town on 07/11/12 at 8:15 am for lab & 8:30 am for Coumadin clinic.

## 2012-07-10 ENCOUNTER — Other Ambulatory Visit (HOSPITAL_BASED_OUTPATIENT_CLINIC_OR_DEPARTMENT_OTHER): Payer: Commercial Managed Care - PPO | Admitting: Lab

## 2012-07-10 ENCOUNTER — Ambulatory Visit (HOSPITAL_BASED_OUTPATIENT_CLINIC_OR_DEPARTMENT_OTHER): Payer: Commercial Managed Care - PPO | Admitting: Pharmacist

## 2012-07-10 DIAGNOSIS — I82409 Acute embolism and thrombosis of unspecified deep veins of unspecified lower extremity: Secondary | ICD-10-CM

## 2012-07-10 DIAGNOSIS — D6859 Other primary thrombophilia: Secondary | ICD-10-CM

## 2012-07-10 DIAGNOSIS — D6861 Antiphospholipid syndrome: Secondary | ICD-10-CM

## 2012-07-10 LAB — PROTIME-INR: INR: 2.1 (ref 2.00–3.50)

## 2012-07-10 NOTE — Progress Notes (Signed)
INR = 2.1 after holding a dose 12/20 then 5 mg/day; 7.5 mg MWF Sarah Harrington has been stable for a long time on COUMADIN 7.5 mg/day but recently had elevated INR's; unexplained. No complaints today.  She had a nice vacation, drove to [place].  Stopped often to walk around & take breaks. INR therapeutic.  Cont 5 mg/day; 7.5 mg MWF I provided pt w/ more samples of COUMADIN 5 mg. INR in 2 weeks. Sarah Harrington, Pharm.D.

## 2012-07-21 ENCOUNTER — Other Ambulatory Visit (HOSPITAL_BASED_OUTPATIENT_CLINIC_OR_DEPARTMENT_OTHER): Payer: Commercial Managed Care - PPO | Admitting: Lab

## 2012-07-21 ENCOUNTER — Ambulatory Visit (HOSPITAL_BASED_OUTPATIENT_CLINIC_OR_DEPARTMENT_OTHER): Payer: Commercial Managed Care - PPO | Admitting: Pharmacist

## 2012-07-21 DIAGNOSIS — D6859 Other primary thrombophilia: Secondary | ICD-10-CM

## 2012-07-21 DIAGNOSIS — I82409 Acute embolism and thrombosis of unspecified deep veins of unspecified lower extremity: Secondary | ICD-10-CM

## 2012-07-21 DIAGNOSIS — D6861 Antiphospholipid syndrome: Secondary | ICD-10-CM

## 2012-07-21 LAB — PROTIME-INR: INR: 2.3 (ref 2.00–3.50)

## 2012-07-21 MED ORDER — WARFARIN SODIUM 5 MG PO TABS: ORAL_TABLET | ORAL | Status: DC

## 2012-07-21 NOTE — Addendum Note (Signed)
Addended by: Elwanda Brooklyn on: 07/21/2012 11:51 AM   Modules accepted: Orders

## 2012-07-21 NOTE — Progress Notes (Signed)
Pt is therapeutic today at 2.3. Will continue to take 5 mg daily and 7.5 mg on M,W,F.  Return 08/18/12 at 8:30 am for lab & 8:45 am for Coumadin clinic.

## 2012-07-21 NOTE — Patient Instructions (Signed)
Take 5 mg daily and 7.5 mg on M,W,F.  Return 08/18/12 at 8:30 am for lab & 8:45 am for Coumadin clinic.

## 2012-08-18 ENCOUNTER — Other Ambulatory Visit: Payer: Commercial Managed Care - PPO | Admitting: Lab

## 2012-08-18 ENCOUNTER — Telehealth: Payer: Self-pay | Admitting: Pharmacist

## 2012-08-18 ENCOUNTER — Ambulatory Visit: Payer: Commercial Managed Care - PPO

## 2012-09-22 ENCOUNTER — Telehealth: Payer: Self-pay | Admitting: Pharmacist

## 2012-09-30 ENCOUNTER — Other Ambulatory Visit: Payer: Self-pay | Admitting: Oncology

## 2012-09-30 DIAGNOSIS — I82409 Acute embolism and thrombosis of unspecified deep veins of unspecified lower extremity: Secondary | ICD-10-CM

## 2012-09-30 DIAGNOSIS — D6861 Antiphospholipid syndrome: Secondary | ICD-10-CM

## 2012-10-02 ENCOUNTER — Ambulatory Visit (HOSPITAL_BASED_OUTPATIENT_CLINIC_OR_DEPARTMENT_OTHER): Payer: Commercial Managed Care - PPO | Admitting: Pharmacist

## 2012-10-02 ENCOUNTER — Other Ambulatory Visit (HOSPITAL_BASED_OUTPATIENT_CLINIC_OR_DEPARTMENT_OTHER): Payer: Commercial Managed Care - PPO | Admitting: Lab

## 2012-10-02 DIAGNOSIS — I82409 Acute embolism and thrombosis of unspecified deep veins of unspecified lower extremity: Secondary | ICD-10-CM

## 2012-10-02 DIAGNOSIS — D6861 Antiphospholipid syndrome: Secondary | ICD-10-CM

## 2012-10-02 DIAGNOSIS — D6859 Other primary thrombophilia: Secondary | ICD-10-CM

## 2012-10-02 LAB — CBC WITH DIFFERENTIAL/PLATELET
BASO%: 0.7 % (ref 0.0–2.0)
EOS%: 3.2 % (ref 0.0–7.0)
MCH: 30.9 pg (ref 25.1–34.0)
MCHC: 33.7 g/dL (ref 31.5–36.0)
NEUT%: 55.1 % (ref 38.4–76.8)
RDW: 13.2 % (ref 11.2–14.5)
lymph#: 1.3 10*3/uL (ref 0.9–3.3)

## 2012-10-02 LAB — PROTIME-INR
INR: 1.7 — ABNORMAL LOW (ref 2.00–3.50)
Protime: 20.4 Seconds — ABNORMAL HIGH (ref 10.6–13.4)

## 2012-10-02 NOTE — Patient Instructions (Addendum)
INR slightly below goal after missing dose Take 10 mg today (3/24) to boost INR Then cotinue taking 5 mg daily except for 7.5 mg on M, W, and F Return on 4/21 at 830 am for lab and 845 am for coumadin clinic

## 2012-10-02 NOTE — Progress Notes (Signed)
INR is below goal this morning. But, patient missed one dose, yesterday Sunday 3/23 due to forgetting. She has been pretty stable on her dose of 5 mg daily except for 7.5 mg on Monday, Wed, and Friday. Will have her take 10 mg today to boost her INR and then continue with regular schedule of 5 mg daily except for 7.5 mg on M, W, and F. Return in one month on 4/21 at 830 am for lab and 845 coumadin clinic

## 2012-10-30 ENCOUNTER — Ambulatory Visit (HOSPITAL_BASED_OUTPATIENT_CLINIC_OR_DEPARTMENT_OTHER): Payer: Commercial Managed Care - PPO | Admitting: Lab

## 2012-10-30 ENCOUNTER — Ambulatory Visit: Payer: Commercial Managed Care - PPO | Admitting: Pharmacist

## 2012-10-30 DIAGNOSIS — I82722 Chronic embolism and thrombosis of deep veins of left upper extremity: Secondary | ICD-10-CM

## 2012-10-30 DIAGNOSIS — D6861 Antiphospholipid syndrome: Secondary | ICD-10-CM

## 2012-10-30 DIAGNOSIS — I82402 Acute embolism and thrombosis of unspecified deep veins of left lower extremity: Secondary | ICD-10-CM

## 2012-10-30 DIAGNOSIS — I82409 Acute embolism and thrombosis of unspecified deep veins of unspecified lower extremity: Secondary | ICD-10-CM

## 2012-10-30 LAB — PROTIME-INR: INR: 2.5 (ref 2.00–3.50)

## 2012-10-30 NOTE — Progress Notes (Signed)
INR within goal today. No problems to report. No changes in diet, medications, etc. Will continue 5 mg daily except 7.5 mg on MWF. Return 11/28/12 at 8:30 am for lab & 8:45 am for Coumadin clinic.

## 2012-10-30 NOTE — Patient Instructions (Addendum)
Continue 5 mg daily except 7.5 mg on MWF. Return 11/28/12 at 8:30 am for lab & 8:45 am for Coumadin clinic.

## 2012-11-28 ENCOUNTER — Other Ambulatory Visit (HOSPITAL_BASED_OUTPATIENT_CLINIC_OR_DEPARTMENT_OTHER): Payer: Commercial Managed Care - PPO | Admitting: Lab

## 2012-11-28 ENCOUNTER — Ambulatory Visit (HOSPITAL_BASED_OUTPATIENT_CLINIC_OR_DEPARTMENT_OTHER): Payer: Commercial Managed Care - PPO | Admitting: Pharmacist

## 2012-11-28 DIAGNOSIS — D6859 Other primary thrombophilia: Secondary | ICD-10-CM

## 2012-11-28 DIAGNOSIS — I82409 Acute embolism and thrombosis of unspecified deep veins of unspecified lower extremity: Secondary | ICD-10-CM

## 2012-11-28 DIAGNOSIS — D6861 Antiphospholipid syndrome: Secondary | ICD-10-CM

## 2012-11-28 DIAGNOSIS — I82402 Acute embolism and thrombosis of unspecified deep veins of left lower extremity: Secondary | ICD-10-CM

## 2012-11-28 DIAGNOSIS — I82722 Chronic embolism and thrombosis of deep veins of left upper extremity: Secondary | ICD-10-CM

## 2012-11-28 LAB — PROTIME-INR
INR: 2.2 (ref 2.00–3.50)
Protime: 26.4 Seconds — ABNORMAL HIGH (ref 10.6–13.4)

## 2012-11-28 NOTE — Progress Notes (Signed)
INR at goal on Coumadin 7.5mg  MWF and 5mg  other days.  No changes in medications.  No other changes to report by Ms Nelva Nay.  Will continue same Coumadin dose and check PT/INR in about 6 weeks.

## 2012-12-18 ENCOUNTER — Telehealth: Payer: Self-pay | Admitting: *Deleted

## 2012-12-18 DIAGNOSIS — D6859 Other primary thrombophilia: Secondary | ICD-10-CM

## 2012-12-18 MED ORDER — WARFARIN SODIUM 5 MG PO TABS: ORAL_TABLET | ORAL | Status: DC

## 2012-12-18 NOTE — Telephone Encounter (Signed)
Pt called & left vm that she needs a refill on her coumadin.  This will be sent to her pharmacy-[facility].

## 2012-12-19 ENCOUNTER — Other Ambulatory Visit: Payer: Self-pay | Admitting: *Deleted

## 2012-12-19 DIAGNOSIS — D6859 Other primary thrombophilia: Secondary | ICD-10-CM

## 2012-12-19 MED ORDER — WARFARIN SODIUM 5 MG PO TABS: ORAL_TABLET | ORAL | Status: DC

## 2013-01-10 ENCOUNTER — Ambulatory Visit: Payer: Commercial Managed Care - PPO | Admitting: Pharmacist

## 2013-01-10 ENCOUNTER — Other Ambulatory Visit (HOSPITAL_BASED_OUTPATIENT_CLINIC_OR_DEPARTMENT_OTHER): Payer: Commercial Managed Care - PPO | Admitting: Lab

## 2013-01-10 DIAGNOSIS — I82722 Chronic embolism and thrombosis of deep veins of left upper extremity: Secondary | ICD-10-CM

## 2013-01-10 DIAGNOSIS — I82402 Acute embolism and thrombosis of unspecified deep veins of left lower extremity: Secondary | ICD-10-CM

## 2013-01-10 DIAGNOSIS — D6861 Antiphospholipid syndrome: Secondary | ICD-10-CM

## 2013-01-10 DIAGNOSIS — I82729 Chronic embolism and thrombosis of deep veins of unspecified upper extremity: Secondary | ICD-10-CM

## 2013-01-10 LAB — PROTIME-INR
INR: 2.2 (ref 2.00–3.50)
Protime: 26.4 Seconds — ABNORMAL HIGH (ref 10.6–13.4)

## 2013-01-10 NOTE — Progress Notes (Signed)
INR continues to be at goal of 2-3 on 7.5mg  MWF and 5mg  other days.  Only complaint is a chronic inflamed area on shin that Ms Coover has had for a long time from "decreased circulation".  She has seen a dermatologist for this and was prescribed a topical corticosteriod.  She is out of this topical steroid.  I suggested she get OTC hydrocortisone 1% until she can get refill or seen by derm again to help with itching.  Will see Ms Venard in 1 month when appt with Dr. Cyndie Chime.  Continue current coumadin dose.

## 2013-02-16 ENCOUNTER — Other Ambulatory Visit (HOSPITAL_BASED_OUTPATIENT_CLINIC_OR_DEPARTMENT_OTHER): Payer: Commercial Managed Care - PPO | Admitting: Lab

## 2013-02-16 ENCOUNTER — Ambulatory Visit: Payer: Commercial Managed Care - PPO | Admitting: Pharmacist

## 2013-02-16 ENCOUNTER — Ambulatory Visit (HOSPITAL_BASED_OUTPATIENT_CLINIC_OR_DEPARTMENT_OTHER): Payer: Commercial Managed Care - PPO | Admitting: Oncology

## 2013-02-16 ENCOUNTER — Telehealth: Payer: Self-pay | Admitting: Oncology

## 2013-02-16 VITALS — BP 137/75 | HR 56 | Temp 98.1°F | Resp 18 | Ht 67.0 in | Wt 194.4 lb

## 2013-02-16 DIAGNOSIS — Z7901 Long term (current) use of anticoagulants: Secondary | ICD-10-CM

## 2013-02-16 DIAGNOSIS — D689 Coagulation defect, unspecified: Secondary | ICD-10-CM

## 2013-02-16 DIAGNOSIS — Z86718 Personal history of other venous thrombosis and embolism: Secondary | ICD-10-CM

## 2013-02-16 DIAGNOSIS — Z86711 Personal history of pulmonary embolism: Secondary | ICD-10-CM

## 2013-02-16 DIAGNOSIS — D6861 Antiphospholipid syndrome: Secondary | ICD-10-CM

## 2013-02-16 DIAGNOSIS — I82402 Acute embolism and thrombosis of unspecified deep veins of left lower extremity: Secondary | ICD-10-CM

## 2013-02-16 DIAGNOSIS — D6859 Other primary thrombophilia: Secondary | ICD-10-CM

## 2013-02-16 LAB — PROTIME-INR
INR: 2.6 (ref 2.00–3.50)
Protime: 31.2 Seconds — ABNORMAL HIGH (ref 10.6–13.4)

## 2013-02-16 LAB — COMPREHENSIVE METABOLIC PANEL (CC13)
AST: 18 U/L (ref 5–34)
Alkaline Phosphatase: 37 U/L — ABNORMAL LOW (ref 40–150)
BUN: 8.3 mg/dL (ref 7.0–26.0)
Calcium: 8.7 mg/dL (ref 8.4–10.4)
Creatinine: 0.7 mg/dL (ref 0.6–1.1)
Glucose: 91 mg/dl (ref 70–140)

## 2013-02-16 LAB — CBC WITH DIFFERENTIAL/PLATELET
EOS%: 1.9 % (ref 0.0–7.0)
LYMPH%: 28.5 % (ref 14.0–49.7)
MCH: 31.8 pg (ref 25.1–34.0)
MCHC: 34 g/dL (ref 31.5–36.0)
MCV: 93.4 fL (ref 79.5–101.0)
MONO%: 7.2 % (ref 0.0–14.0)
RBC: 4.26 10*6/uL (ref 3.70–5.45)
RDW: 13 % (ref 11.2–14.5)

## 2013-02-16 NOTE — Progress Notes (Signed)
INR at goal today. Pt in to see Dr. Cyndie Chime as well. Pt is doing great with no issues. No missed doses/extra doses or bruising/bleeding. We will make no changes to her plan and continue 5 mg coumadin daily except 7.5 mg on MWF. Return 03/30/13 at 9am for lab coumadin clinic at 9:15am

## 2013-02-16 NOTE — Telephone Encounter (Signed)
gv and printed appt sched and avs for pt  °

## 2013-02-16 NOTE — Patient Instructions (Signed)
Continue 5 mg daily except 7.5 mg on MWF. Return 03/30/13 at 9am for lab coumadin clinic at 9:15am

## 2013-02-19 NOTE — Progress Notes (Signed)
Hematology and Oncology Follow Up Visit  Sarah Harrington [id number] 10/27/1965 47 y.o. 02/19/2013 9:02 AM   Principle Diagnosis: Encounter Diagnoses  Name Primary?  [name] Antiphospholipid antibody syndrome Yes  . DVT (deep venous thrombosis), left   . Antiphospholipid syndrome   . 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  She was also on oral contraceptives.  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  .  The lupus anticoagulant has been variably positive over the years.  The IgA antibody has been persistently elevated.  I stopped her anticoagulation on 08/01/06 and just put her on one aspirin a day.  Unfortunately, she developed left calf pain and swelling and was evaluated on 08/31/07 with findings of extensive superficial thrombosis of the greater saphenous vein with calf veins also being thrombosed.  She was put back on full dose anticoagulation at that time and has had no subsequent thrombotic events.   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  A follow-up Doppler study done on 10/28/08 did not show any recurrent clot at that time.  D-dimer testing done 08/31/07 remained elevated at 1.73, normal less than 0.48 but a follow-up study done 01/29/08 had normalized down to 0.22.      She has developed a rash on the extensor surface of her left shin. She did see a dermatologist. This was felt to be a dermatitis related to the venous stasis.  This rash has persisted.   Medications: reviewed  Allergies: No Known Allergies  Review of Systems: Constitutional:   No constitutional symptoms Respiratory: No cough or dyspnea Cardiovascular:  No chest pain or  palpitations Gastrointestinal: No change in bowel habit Genito-Urinary: No vaginal bleeding Musculoskeletal: No muscle bone or joint pain Neurologic: No headache or change in vision Skin: Persistent venous stasis changes left lower extremity with a impending stasis ulcer anterior shin Remaining ROS negative.  Physical Exam: Blood pressure 137/75, pulse 56, temperature 98.1 F (36.7 C), temperature source Oral, resp. rate 18, height 5\' 7"  (1.702 m), weight 194 lb 6.4 oz (88.179 kg). Wt Readings from Last 3 Encounters:  02/16/13 194 lb 6.4 oz (88.179 kg)  02/11/12 201 lb 12.8 oz (91.536 kg)  03/06/10 188 lb 8 oz (85.503 kg)     General appearance: Well-nourished Caucasian woman HENNT: Pharynx no erythema or exudate Lymph nodes: No lymphadenopathy Breasts: Lungs: Clear to auscultation resonant to percussion Heart: Regular rhythm no murmur Abdomen: Soft, nontender, no mass, no organomegaly Extremities: Extensive venous stasis changes left lower extremity. Large, approximate 10 cm oval area, erythematous, raised, mid left shin. Musculoskeletal: No joint deformities GU: Vascular: No carotid bruits, no cyanosis Neurologic: Mental status normal,, PERRLA, optic disc sharp, vessels normal, motor strength 5 over 5, reflexes 1+ symmetric Skin: See extremity exam above  Lab Results: Lab Results  Component Value Date   WBC 5.3 02/16/2013   HGB 13.5 02/16/2013   HCT 39.8 02/16/2013   MCV 93.4 02/16/2013   PLT 310 02/16/2013     Chemistry      Component Value Date/Time   NA 139 02/16/2013 1155   NA 136 01/28/2011 0812   K 4.0 02/16/2013 1155   K 4.5  01/28/2011 0812   CL 104 01/28/2011 0812   CO2 23 02/16/2013 1155   CO2 21 01/28/2011 0812   BUN 8.3 02/16/2013 1155   BUN 10 01/28/2011 0812   CREATININE 0.7 02/16/2013 1155   CREATININE 0.81 01/28/2011 0812      Component Value Date/Time   CALCIUM 8.7 02/16/2013 1155   CALCIUM 8.6 01/28/2011 0812   ALKPHOS 37* 02/16/2013 1155   ALKPHOS 30* 01/28/2011 0812    AST 18 02/16/2013 1155   AST 22 01/28/2011 0812   ALT 13 02/16/2013 1155   ALT 14 01/28/2011 0812   BILITOT 0.50 02/16/2013 1155   BILITOT 0.4 01/28/2011 0812      Impression: #1. 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  #2. Status post pulmonary embolism and left lower extremity DVT August 2002.  #3. History of extensive superficial phlebitis left lower extremity 2009.  #4. 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  Plan: She will continue full dose Coumadin anticoagulation monitored in our office. We discussed again the availability of the new oral anticoagulants and the risks and benefits of these drugs. We expect to have a reversal agent within the next 2 years. She is stable on Coumadin and prefers to stay on the Coumadin for now.  I wwould like to make a referral to the [facility] for further evaluation to see if there are any recommendations with respect to her impending venous stasis ulcer and chronic venous stasis changes of her left lower extremity.   CC:.    Levert Feinstein, MD 8/11/20149:02 AM

## 2013-03-23 ENCOUNTER — Other Ambulatory Visit: Payer: Commercial Managed Care - PPO | Admitting: Lab

## 2013-03-23 ENCOUNTER — Ambulatory Visit: Payer: Commercial Managed Care - PPO

## 2013-03-30 ENCOUNTER — Other Ambulatory Visit (HOSPITAL_BASED_OUTPATIENT_CLINIC_OR_DEPARTMENT_OTHER): Payer: Commercial Managed Care - PPO | Admitting: Lab

## 2013-03-30 ENCOUNTER — Ambulatory Visit: Payer: Commercial Managed Care - PPO | Admitting: Pharmacist

## 2013-03-30 DIAGNOSIS — I82722 Chronic embolism and thrombosis of deep veins of left upper extremity: Secondary | ICD-10-CM

## 2013-03-30 DIAGNOSIS — I82729 Chronic embolism and thrombosis of deep veins of unspecified upper extremity: Secondary | ICD-10-CM

## 2013-03-30 DIAGNOSIS — D6859 Other primary thrombophilia: Secondary | ICD-10-CM

## 2013-03-30 DIAGNOSIS — D6861 Antiphospholipid syndrome: Secondary | ICD-10-CM

## 2013-03-30 DIAGNOSIS — I82402 Acute embolism and thrombosis of unspecified deep veins of left lower extremity: Secondary | ICD-10-CM

## 2013-03-30 LAB — PROTIME-INR
INR: 1.9 — ABNORMAL LOW (ref 2.00–3.50)
Protime: 22.8 Seconds — ABNORMAL HIGH (ref 10.6–13.4)

## 2013-03-30 MED ORDER — WARFARIN SODIUM 5 MG PO TABS: ORAL_TABLET | ORAL | Status: DC

## 2013-03-30 NOTE — Progress Notes (Signed)
INR close to goal of 2-3.  Ms Koone missed her coumadin dose on 03/27/13 b/c she ran out of coumadin.  No other changes.  Will continue coumadin 7.5mg  MWF and 5mg  other days.  Will check PT/INR in 2 months.  C[facility] at [facility] in [place].

## 2013-05-31 ENCOUNTER — Ambulatory Visit (HOSPITAL_BASED_OUTPATIENT_CLINIC_OR_DEPARTMENT_OTHER): Payer: Self-pay | Admitting: Pharmacist

## 2013-05-31 ENCOUNTER — Other Ambulatory Visit (HOSPITAL_BASED_OUTPATIENT_CLINIC_OR_DEPARTMENT_OTHER): Payer: Commercial Managed Care - PPO | Admitting: Lab

## 2013-05-31 DIAGNOSIS — I82722 Chronic embolism and thrombosis of deep veins of left upper extremity: Secondary | ICD-10-CM

## 2013-05-31 DIAGNOSIS — D6861 Antiphospholipid syndrome: Secondary | ICD-10-CM

## 2013-05-31 DIAGNOSIS — I82729 Chronic embolism and thrombosis of deep veins of unspecified upper extremity: Secondary | ICD-10-CM

## 2013-05-31 DIAGNOSIS — D6859 Other primary thrombophilia: Secondary | ICD-10-CM

## 2013-05-31 LAB — PROTIME-INR: INR: 2.2 (ref 2.00–3.50)

## 2013-05-31 NOTE — Patient Instructions (Signed)
Continue 5 mg daily except 7.5 mg on MWF. Return 07/31/12 at 8:30am for lab coumadin clinic at 8:45am

## 2013-05-31 NOTE — Progress Notes (Signed)
Pt seen in clinic today INR=2.2 on 5mg  daily with 7.5 mg on MWF No changes to report. Medications are still only ASA and Coumadin She has 2 refills remaining Continue 5 mg daily except 7.5 mg on MWF. Return 07/31/12 at 8:30am for lab coumadin clinic at 8:45am

## 2013-07-31 ENCOUNTER — Ambulatory Visit: Payer: Commercial Managed Care - PPO

## 2013-07-31 ENCOUNTER — Telehealth: Payer: Self-pay | Admitting: Pharmacist

## 2013-07-31 ENCOUNTER — Other Ambulatory Visit: Payer: Commercial Managed Care - PPO

## 2013-08-14 ENCOUNTER — Ambulatory Visit (HOSPITAL_BASED_OUTPATIENT_CLINIC_OR_DEPARTMENT_OTHER): Payer: Self-pay | Admitting: Pharmacist

## 2013-08-14 ENCOUNTER — Other Ambulatory Visit (HOSPITAL_BASED_OUTPATIENT_CLINIC_OR_DEPARTMENT_OTHER): Payer: Commercial Managed Care - PPO

## 2013-08-14 DIAGNOSIS — I82409 Acute embolism and thrombosis of unspecified deep veins of unspecified lower extremity: Secondary | ICD-10-CM

## 2013-08-14 DIAGNOSIS — D6859 Other primary thrombophilia: Secondary | ICD-10-CM

## 2013-08-14 DIAGNOSIS — D6861 Antiphospholipid syndrome: Secondary | ICD-10-CM

## 2013-08-14 DIAGNOSIS — I82722 Chronic embolism and thrombosis of deep veins of left upper extremity: Secondary | ICD-10-CM

## 2013-08-14 LAB — POCT INR: INR: 2.4

## 2013-08-14 LAB — PROTIME-INR
INR: 2.4 (ref 2.00–3.50)
Protime: 28.8 Seconds — ABNORMAL HIGH (ref 10.6–13.4)

## 2013-08-14 NOTE — Progress Notes (Signed)
INR at goal Patient is doing well today with no complaints No issues regarding unusual bleeding or bruising No diet or medication changes No missed or extra doses Pt to have minor procedure done tomorrow on a vein in her leg (Dr. Cyndie ChimeGranfortuna aware, no indication for holding coumadin) pt and doctor performing procedure are comfortable with this Anticoagulation Plan: No changes Continue 5 mg daily except 7.5 mg on MWF. Return 10/16/12 at 8:30am for lab coumadin clinic at 8:45am

## 2013-08-14 NOTE — Patient Instructions (Addendum)
INR at goal No changes Continue 5 mg daily except 7.5 mg on MWF. Return 10/16/12 at 8:30am for lab coumadin clinic at 8:45am

## 2013-09-06 ENCOUNTER — Other Ambulatory Visit: Payer: Self-pay | Admitting: *Deleted

## 2013-09-06 DIAGNOSIS — D6859 Other primary thrombophilia: Secondary | ICD-10-CM

## 2013-09-06 MED ORDER — WARFARIN SODIUM 5 MG PO TABS: ORAL_TABLET | ORAL | Status: DC

## 2013-09-10 ENCOUNTER — Encounter: Payer: Self-pay | Admitting: Oncology

## 2013-09-19 ENCOUNTER — Encounter: Payer: Self-pay | Admitting: Oncology

## 2013-10-16 ENCOUNTER — Other Ambulatory Visit: Payer: Commercial Managed Care - PPO

## 2013-10-16 ENCOUNTER — Ambulatory Visit: Payer: Commercial Managed Care - PPO

## 2013-10-18 ENCOUNTER — Ambulatory Visit (HOSPITAL_BASED_OUTPATIENT_CLINIC_OR_DEPARTMENT_OTHER): Payer: Commercial Managed Care - PPO | Admitting: Pharmacist

## 2013-10-18 ENCOUNTER — Other Ambulatory Visit (HOSPITAL_BASED_OUTPATIENT_CLINIC_OR_DEPARTMENT_OTHER): Payer: Commercial Managed Care - PPO

## 2013-10-18 ENCOUNTER — Other Ambulatory Visit: Payer: Commercial Managed Care - PPO

## 2013-10-18 ENCOUNTER — Ambulatory Visit: Payer: Commercial Managed Care - PPO

## 2013-10-18 DIAGNOSIS — Z86711 Personal history of pulmonary embolism: Secondary | ICD-10-CM

## 2013-10-18 DIAGNOSIS — Z86718 Personal history of other venous thrombosis and embolism: Secondary | ICD-10-CM

## 2013-10-18 DIAGNOSIS — D6861 Antiphospholipid syndrome: Secondary | ICD-10-CM

## 2013-10-18 DIAGNOSIS — D6859 Other primary thrombophilia: Secondary | ICD-10-CM

## 2013-10-18 DIAGNOSIS — I82722 Chronic embolism and thrombosis of deep veins of left upper extremity: Secondary | ICD-10-CM

## 2013-10-18 DIAGNOSIS — I82409 Acute embolism and thrombosis of unspecified deep veins of unspecified lower extremity: Secondary | ICD-10-CM

## 2013-10-18 LAB — POCT INR: INR: 2.3

## 2013-10-18 LAB — PROTIME-INR
INR: 2.3 (ref 2.00–3.50)
Protime: 27.6 Seconds — ABNORMAL HIGH (ref 10.6–13.4)

## 2013-10-18 NOTE — Patient Instructions (Signed)
Continue 5mg  daily except 7.5mg  on MWF. Return in 8 weeks on 12/11/13: Lab at 8:30am and  coumadin clinic at 8:45am.

## 2013-10-18 NOTE — Progress Notes (Signed)
INR within goal today. Pt doing well. No problems or concerns regarding anticoagulation. No changes in diet or medications. No missed doses of coumadin. Pt did have laser procedure done on her left ankle to help with chronic swelling. Ankle is much improved. She has one remaining f/u apt with [facility]. Continue 5 mg daily except 7.5 mg on MWF. Return in 8 weeks on 12/11/13: Lab at 8:30am and  coumadin clinic at 8:45am. Will order CBC with next visit.

## 2013-11-30 ENCOUNTER — Other Ambulatory Visit: Payer: Self-pay | Admitting: Pharmacist

## 2013-11-30 DIAGNOSIS — D6859 Other primary thrombophilia: Secondary | ICD-10-CM

## 2013-12-11 ENCOUNTER — Other Ambulatory Visit (HOSPITAL_BASED_OUTPATIENT_CLINIC_OR_DEPARTMENT_OTHER): Payer: Commercial Managed Care - PPO

## 2013-12-11 ENCOUNTER — Ambulatory Visit (HOSPITAL_BASED_OUTPATIENT_CLINIC_OR_DEPARTMENT_OTHER): Payer: Commercial Managed Care - PPO | Admitting: Pharmacist

## 2013-12-11 DIAGNOSIS — D6861 Antiphospholipid syndrome: Secondary | ICD-10-CM

## 2013-12-11 DIAGNOSIS — D6859 Other primary thrombophilia: Secondary | ICD-10-CM

## 2013-12-11 DIAGNOSIS — I82409 Acute embolism and thrombosis of unspecified deep veins of unspecified lower extremity: Secondary | ICD-10-CM

## 2013-12-11 LAB — POCT INR: INR: 1.8

## 2013-12-11 LAB — CBC WITH DIFFERENTIAL/PLATELET
BASO%: 0.7 % (ref 0.0–2.0)
BASOS ABS: 0 10*3/uL (ref 0.0–0.1)
EOS ABS: 0.1 10*3/uL (ref 0.0–0.5)
EOS%: 2.2 % (ref 0.0–7.0)
HEMATOCRIT: 40.6 % (ref 34.8–46.6)
HEMOGLOBIN: 14.1 g/dL (ref 11.6–15.9)
LYMPH#: 1.4 10*3/uL (ref 0.9–3.3)
LYMPH%: 26.1 % (ref 14.0–49.7)
MCH: 31.3 pg (ref 25.1–34.0)
MCHC: 34.7 g/dL (ref 31.5–36.0)
MCV: 90.2 fL (ref 79.5–101.0)
MONO#: 0.5 10*3/uL (ref 0.1–0.9)
MONO%: 8.3 % (ref 0.0–14.0)
NEUT#: 3.5 10*3/uL (ref 1.5–6.5)
NEUT%: 62.7 % (ref 38.4–76.8)
PLATELETS: 280 10*3/uL (ref 145–400)
RBC: 4.5 10*6/uL (ref 3.70–5.45)
RDW: 13.3 % (ref 11.2–14.5)
WBC: 5.5 10*3/uL (ref 3.9–10.3)

## 2013-12-11 LAB — PROTIME-INR
INR: 1.8 — ABNORMAL LOW (ref 2.00–3.50)
Protime: 21.6 Seconds — ABNORMAL HIGH (ref 10.6–13.4)

## 2013-12-11 NOTE — Progress Notes (Signed)
INR slightly below goal today.  Sarah Harrington states that she is not surprised since she missed a coumadin dose within the past week.  No changes in meds.  No bleeding/bruising.  Sarah Harrington has been taking current coumadin dose of 7.5mg  MWF and 5mg  other days for about 1.5 years.  Will take 7.5mg  today instead of 5mg , then resume usual dose.  Will check PT/INR in about 2 months.

## 2014-02-05 ENCOUNTER — Ambulatory Visit (HOSPITAL_BASED_OUTPATIENT_CLINIC_OR_DEPARTMENT_OTHER): Payer: Commercial Managed Care - PPO | Admitting: Pharmacist

## 2014-02-05 ENCOUNTER — Other Ambulatory Visit (HOSPITAL_BASED_OUTPATIENT_CLINIC_OR_DEPARTMENT_OTHER): Payer: Commercial Managed Care - PPO

## 2014-02-05 DIAGNOSIS — I82409 Acute embolism and thrombosis of unspecified deep veins of unspecified lower extremity: Secondary | ICD-10-CM

## 2014-02-05 DIAGNOSIS — D6859 Other primary thrombophilia: Secondary | ICD-10-CM

## 2014-02-05 DIAGNOSIS — D6861 Antiphospholipid syndrome: Secondary | ICD-10-CM

## 2014-02-05 LAB — PROTIME-INR
INR: 1.7 — AB (ref 2.00–3.50)
Protime: 20.4 Seconds — ABNORMAL HIGH (ref 10.6–13.4)

## 2014-02-05 LAB — POCT INR: INR: 1.7

## 2014-02-05 NOTE — Progress Notes (Signed)
INR = 1.7 on Coumadin 5 mg daily except 7.5 mg on MWF. Pt had low INR (= 1.8) at last visit in June also. She did have fresh spinach salad a couple of times last week. No s/sxs of VTE. No recent med changes. She already took her Coumadin today. INR again is low so we have to make a dose adjustment--> Increase to 7.5 mg daily except 5 mg on MWF.  Pt will take additional 2.5 mg today to total 7.5 mg today. Repeat INR in 2 weeks. Ebony HailGinna Trelon Plush, Pharm.D., CPP 02/05/2014@8 :38 AM

## 2014-02-15 ENCOUNTER — Ambulatory Visit: Payer: Commercial Managed Care - PPO | Admitting: Oncology

## 2014-02-15 ENCOUNTER — Other Ambulatory Visit: Payer: Commercial Managed Care - PPO

## 2014-02-18 NOTE — Telephone Encounter (Signed)
Phone call - encounter closed. 

## 2014-02-19 ENCOUNTER — Other Ambulatory Visit: Payer: Commercial Managed Care - PPO

## 2014-02-19 ENCOUNTER — Ambulatory Visit (HOSPITAL_BASED_OUTPATIENT_CLINIC_OR_DEPARTMENT_OTHER): Payer: Commercial Managed Care - PPO | Admitting: Pharmacist

## 2014-02-19 DIAGNOSIS — D6859 Other primary thrombophilia: Secondary | ICD-10-CM

## 2014-02-19 DIAGNOSIS — D6861 Antiphospholipid syndrome: Secondary | ICD-10-CM

## 2014-02-19 DIAGNOSIS — I82402 Acute embolism and thrombosis of unspecified deep veins of left lower extremity: Secondary | ICD-10-CM

## 2014-02-19 LAB — PROTIME-INR
INR: 2.1 (ref 2.00–3.50)
Protime: 25.2 Seconds — ABNORMAL HIGH (ref 10.6–13.4)

## 2014-02-19 LAB — POCT INR: INR: 2.1

## 2014-02-19 MED ORDER — WARFARIN SODIUM 5 MG PO TABS: ORAL_TABLET | ORAL | Status: DC

## 2014-02-19 NOTE — Progress Notes (Signed)
INR within goal today.  Pt doing well. No problems or concerns regarding anticoagulation.  No changes in diet or medications.  No missed doses of coumadin. No s/s of clotting noted. No unusual bruising or bleeding. Continue 7.5 mg daily except 5 mg on MWF. Return on 03/27/14: Lab at 8:30am and coumadin clinic at 8:45am. CBC ordered for review with next visit. Refill on her Coumadin was called to [facility] [address] in [place], 90-day supply requested.

## 2014-02-19 NOTE — Patient Instructions (Signed)
Continue 7.5 mg daily except 5 mg on MWF. Return on 03/27/14: Lab at 8:30am and coumadin clinic at 8:45am.

## 2014-03-07 NOTE — Telephone Encounter (Signed)
Encounter was telephone call. 

## 2014-03-27 ENCOUNTER — Other Ambulatory Visit: Payer: Commercial Managed Care - PPO

## 2014-03-27 ENCOUNTER — Telehealth: Payer: Self-pay | Admitting: Pharmacist

## 2014-03-27 ENCOUNTER — Ambulatory Visit: Payer: Commercial Managed Care - PPO

## 2014-03-27 NOTE — Telephone Encounter (Signed)
Patient FTKA today for lab and coumadin clinic. I called and left a VM for patient to call back and reschedule  Thank you!  Christell Faith, PharmD, BCOP

## 2014-04-11 ENCOUNTER — Ambulatory Visit (HOSPITAL_BASED_OUTPATIENT_CLINIC_OR_DEPARTMENT_OTHER): Payer: Commercial Managed Care - PPO | Admitting: Pharmacist

## 2014-04-11 ENCOUNTER — Other Ambulatory Visit (HOSPITAL_BASED_OUTPATIENT_CLINIC_OR_DEPARTMENT_OTHER): Payer: Commercial Managed Care - PPO

## 2014-04-11 DIAGNOSIS — D6859 Other primary thrombophilia: Secondary | ICD-10-CM

## 2014-04-11 DIAGNOSIS — D6861 Antiphospholipid syndrome: Secondary | ICD-10-CM

## 2014-04-11 DIAGNOSIS — I82402 Acute embolism and thrombosis of unspecified deep veins of left lower extremity: Secondary | ICD-10-CM

## 2014-04-11 LAB — PROTIME-INR
INR: 2.6 (ref 2.00–3.50)
PROTIME: 31.2 s — AB (ref 10.6–13.4)

## 2014-04-11 LAB — POCT INR: INR: 2.6

## 2014-04-11 NOTE — Patient Instructions (Signed)
INR at goal No changes Continue 7.5 mg daily except 5 mg on MWF. Return on 05/31/14: Lab at 8:00am and coumadin clinic at 8:15am.

## 2014-04-11 NOTE — Progress Notes (Signed)
INR at goal Pt is doing very well No complaints or issues to report No diet or medication changes No unusual bleeding or bruising No missed or extra doses Plan:  Continue 7.5 mg daily except 5 mg on MWF. Return on 05/31/14: Lab at 8:00am and coumadin clinic at 8:15am.

## 2014-05-07 ENCOUNTER — Encounter: Payer: Self-pay | Admitting: Oncology

## 2014-05-07 ENCOUNTER — Ambulatory Visit (INDEPENDENT_AMBULATORY_CARE_PROVIDER_SITE_OTHER): Payer: Commercial Managed Care - PPO | Admitting: Oncology

## 2014-05-07 ENCOUNTER — Other Ambulatory Visit: Payer: Self-pay | Admitting: Oncology

## 2014-05-07 VITALS — BP 122/75 | HR 58 | Temp 97.8°F | Ht 66.0 in | Wt 203.5 lb

## 2014-05-07 DIAGNOSIS — Z7901 Long term (current) use of anticoagulants: Secondary | ICD-10-CM

## 2014-05-07 DIAGNOSIS — Z86711 Personal history of pulmonary embolism: Secondary | ICD-10-CM

## 2014-05-07 DIAGNOSIS — D6861 Antiphospholipid syndrome: Secondary | ICD-10-CM

## 2014-05-07 NOTE — Progress Notes (Signed)
Patient ID: Sarah Harrington, female   DOB: 11/20/1965, 48 y.o.   MRN: [id number] Hematology and Oncology Follow Up Visit  Sarah Harrington [id number] 06/04/1966 48 y.o. 05/07/2014 9:26 AM   Principle Diagnosis: Encounter Diagnoses  Name Primary?  [name]. Antiphospholipid antibody syndrome Yes  . Antiphospholipid syndrome   . History of pulmonary embolus (PE)   . 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 She was also on oral contraceptives. 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 . The lupus anticoagulant has been variably positive over the years. The IgA antibody has been persistently elevated. I stopped her anticoagulation on 08/01/06 and just put her on one aspirin a day. Unfortunately, she developed left calf pain and swelling and was evaluated on 08/31/07 with findings of extensive superficial thrombosis of the greater saphenous vein with calf veins also being thrombosed. She was put back on full dose anticoagulation at that time and has had no subsequent thrombotic events.  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 A follow-up Doppler study done on 10/28/08 did not show any recurrent clot at that time. D-dimer testing done 08/31/07 remained elevated at 1.73, normal less than 0.48 but a follow-up study done 01/29/08 had normalized down to 0.22.  Since last visit she did have laser vein surgery with a good outcome.  She remains on warfarin and is followed [facility] Coumadin clinic with lab checks every other month. Most recent INR done on 04/11/2014 was 2.6 on Coumadin 7.5 mg daily except 5 mg Monday Wednesdays and Fridays.  She's had no interim health problems. She  denies any dyspnea chest pain or palpitations. No recurrent leg pain or swelling.  Her family is doing well. One child is in the sixth grade, one child just started college, another job just started high school.   Medications: reviewed  Allergies: No Known Allergies  Review of Systems: See history of present illness Remaining ROS negative:   Physical Exam: Blood pressure 122/75, pulse 58, temperature 97.8 F (36.6 C), temperature source Oral, height 5\' 6"  (1.676 m), weight 203 lb 8 oz (92.307 kg), SpO2 98.00%. Wt Readings from Last 3 Encounters:  05/07/14 203 lb 8 oz (92.307 kg)  02/16/13 194 lb 6.4 oz (88.179 kg)  02/11/12 201 lb 12.8 oz (91.536 kg)     General appearance: Well-nourished Caucasian woman HENNT: Pharynx no erythema, exudate, mass, or ulcer. No thyromegaly or thyroid nodules Lymph nodes: No cervical, supraclavicular, or axillary lymphadenopathy Breasts:  Lungs: Clear to auscultation, resonant to percussion throughout Heart: Regular rhythm, no murmur, no gallop, no rub, no click, no edema Abdomen: Soft, nontender, normal bowel sounds, no mass, no organomegaly Extremities: No edema, no calf tenderness Musculoskeletal: no joint deformities GU:  Vascular: Carotid pulses 2+, no bruits, distal pulses: Dorsalis pedis 1+ symmetric Neurologic: Alert, oriented, PERRLA, optic discs sharp and vessels normal, no hemorrhage or exudate, cranial nerves grossly normal, motor strength 5 over 5, reflexes 1+ symmetric, upper body coordination normal, gait normal, Skin: No rash or ecchymosis  Lab Results: CBC W/Diff    Component Value Date/Time   WBC 5.5 12/11/2013 0846   RBC 4.50 12/11/2013 0846   HGB 14.1 12/11/2013  0846   HCT 40.6 12/11/2013 0846   PLT 280 12/11/2013 0846   MCV 90.2 12/11/2013 0846   MCH 31.3 12/11/2013 0846   MCHC 34.7 12/11/2013 0846   RDW 13.3 12/11/2013 0846   LYMPHSABS 1.4 12/11/2013 0846   MONOABS 0.5 12/11/2013 0846   EOSABS 0.1 12/11/2013 0846   BASOSABS 0.0 12/11/2013  0846     Chemistry      Component Value Date/Time   NA 139 02/16/2013 1155   NA 136 01/28/2011 0812   K 4.0 02/16/2013 1155   K 4.5 01/28/2011 0812   CL 104 01/28/2011 0812   CO2 23 02/16/2013 1155   CO2 21 01/28/2011 0812   BUN 8.3 02/16/2013 1155   BUN 10 01/28/2011 0812   CREATININE 0.7 02/16/2013 1155   CREATININE 0.81 01/28/2011 0812      Component Value Date/Time   CALCIUM 8.7 02/16/2013 1155   CALCIUM 8.6 01/28/2011 0812   ALKPHOS 37* 02/16/2013 1155   ALKPHOS 30* 01/28/2011 0812   AST 18 02/16/2013 1155   AST 22 01/28/2011 0812   ALT 13 02/16/2013 1155   ALT 14 01/28/2011 0812   BILITOT 0.50 02/16/2013 1155   BILITOT 0.4 01/28/2011 0812    .  Impression:   #1. Antiphospholipid antibody syndrome #2.[name]. 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 Chronic warfarin anticoagulation secondary to #1 and 2. 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  We again discussed an update on the new oral anticoagulants. Safety and efficacy have been demonstrated over the last 4 years since the drugs have been on the open market. We just got approval for an antidote to dabigitran. We should have antidote to the factor Xa. inhibitors soon as well. Not having an antidote has not been an obstacle to the use of these drugs in the majority of cases. We discussed issues of insurance reimbursement since the drugs are more expensive than warfarin. She will take some time to consider whether she wants to change over. On her   CC: Patient Care Team: Sandford CrazeMelissa O'Sullivan, NP as PCP - General   Levert FeinsteinGRANFORTUNA,JAMES M, MD 10/27/20159:26 AM

## 2014-05-07 NOTE — Patient Instructions (Signed)
Continue to monitor coumadin at the [facility] Return visit with Dr Reece AgarG 1 year at [facility]

## 2014-05-31 ENCOUNTER — Ambulatory Visit: Payer: Commercial Managed Care - PPO

## 2014-05-31 ENCOUNTER — Other Ambulatory Visit: Payer: Commercial Managed Care - PPO

## 2014-05-31 ENCOUNTER — Telehealth: Payer: Self-pay | Admitting: Pharmacist

## 2014-05-31 NOTE — Telephone Encounter (Signed)
Patient FTKA today. Left VM at her home asking her to reschedule.

## 2014-06-28 ENCOUNTER — Other Ambulatory Visit: Payer: Commercial Managed Care - PPO

## 2014-07-02 ENCOUNTER — Ambulatory Visit (HOSPITAL_BASED_OUTPATIENT_CLINIC_OR_DEPARTMENT_OTHER): Payer: Self-pay | Admitting: Oncology

## 2014-07-02 ENCOUNTER — Ambulatory Visit (HOSPITAL_BASED_OUTPATIENT_CLINIC_OR_DEPARTMENT_OTHER): Payer: Commercial Managed Care - PPO | Admitting: Pharmacist

## 2014-07-02 DIAGNOSIS — D6861 Antiphospholipid syndrome: Secondary | ICD-10-CM

## 2014-07-02 DIAGNOSIS — D6859 Other primary thrombophilia: Secondary | ICD-10-CM

## 2014-07-02 DIAGNOSIS — I82402 Acute embolism and thrombosis of unspecified deep veins of left lower extremity: Secondary | ICD-10-CM

## 2014-07-02 DIAGNOSIS — D6852 Prothrombin gene mutation: Secondary | ICD-10-CM

## 2014-07-02 LAB — POCT INR: INR: 2.4

## 2014-07-02 LAB — PROTIME-INR
INR: 2.4 (ref 2.00–3.50)
PROTIME: 28.8 s — AB (ref 10.6–13.4)

## 2014-07-02 NOTE — Patient Instructions (Signed)
Continue 7.5 mg daily except 5 mg on MWF. Return on 08/27/14: Lab at 8:30am and coumadin clinic at 8:45am.

## 2014-07-02 NOTE — Progress Notes (Signed)
INR at goal Pt is doing well. No problems regarding anticoagulation. No complaints or issues to report. No medication changes. She has had a few mores sweets and celebratory drinks than usual in the last couple weeks in this holiday season. No unusual bleeding or bruising.  No s/s of clotting noted. No missed or extra doses. Continue 7.5 mg daily except 5 mg on MWF. Return on 08/27/14: Lab at 8:30am and coumadin clinic at 8:45am.

## 2014-08-05 NOTE — Progress Notes (Signed)
Patient ID: Sarah ReilSusan M Harrington, female   DOB: 10/03/1965, 49 y.o.   MRN: [id number] Lab only visit

## 2014-08-20 ENCOUNTER — Other Ambulatory Visit: Payer: Self-pay | Admitting: Physician Assistant

## 2014-08-20 DIAGNOSIS — Z1231 Encounter for screening mammogram for malignant neoplasm of breast: Secondary | ICD-10-CM

## 2014-08-27 ENCOUNTER — Other Ambulatory Visit (HOSPITAL_BASED_OUTPATIENT_CLINIC_OR_DEPARTMENT_OTHER): Payer: Commercial Managed Care - PPO

## 2014-08-27 ENCOUNTER — Ambulatory Visit (HOSPITAL_BASED_OUTPATIENT_CLINIC_OR_DEPARTMENT_OTHER): Payer: Self-pay | Admitting: Pharmacist

## 2014-08-27 DIAGNOSIS — D6861 Antiphospholipid syndrome: Secondary | ICD-10-CM

## 2014-08-27 DIAGNOSIS — I82402 Acute embolism and thrombosis of unspecified deep veins of left lower extremity: Secondary | ICD-10-CM

## 2014-08-27 DIAGNOSIS — Z86711 Personal history of pulmonary embolism: Secondary | ICD-10-CM

## 2014-08-27 LAB — COMPREHENSIVE METABOLIC PANEL (CC13)
ALK PHOS: 34 U/L — AB (ref 40–150)
ALT: 13 U/L (ref 0–55)
AST: 15 U/L (ref 5–34)
Albumin: 3.5 g/dL (ref 3.5–5.0)
Anion Gap: 9 mEq/L (ref 3–11)
BILIRUBIN TOTAL: 0.21 mg/dL (ref 0.20–1.20)
BUN: 10.5 mg/dL (ref 7.0–26.0)
CO2: 22 mEq/L (ref 22–29)
Calcium: 8.6 mg/dL (ref 8.4–10.4)
Chloride: 107 mEq/L (ref 98–109)
Creatinine: 0.7 mg/dL (ref 0.6–1.1)
EGFR: >90 mL/min/{1.73_m2} (ref >90)
Glucose: 104 mg/dl (ref 70–140)
Potassium: 4.1 mEq/L (ref 3.5–5.1)
SODIUM: 138 meq/L (ref 136–145)
Total Protein: 6.4 g/dL (ref 6.4–8.3)

## 2014-08-27 LAB — CBC WITH DIFFERENTIAL/PLATELET
BASO%: 1 % (ref 0.0–2.0)
Basophils Absolute: 0 10*3/uL (ref 0.0–0.1)
EOS%: 3.3 % (ref 0.0–7.0)
Eosinophils Absolute: 0.1 10*3/uL (ref 0.0–0.5)
HEMATOCRIT: 42.3 % (ref 34.8–46.6)
HGB: 13.9 g/dL (ref 11.6–15.9)
LYMPH%: 27.6 % (ref 14.0–49.7)
MCH: 30.8 pg (ref 25.1–34.0)
MCHC: 32.9 g/dL (ref 31.5–36.0)
MCV: 93.8 fL (ref 79.5–101.0)
MONO#: 0.3 10*3/uL (ref 0.1–0.9)
MONO%: 8.5 % (ref 0.0–14.0)
NEUT#: 2.4 10*3/uL (ref 1.5–6.5)
NEUT%: 59.6 % (ref 38.4–76.8)
PLATELETS: 285 10*3/uL (ref 145–400)
RBC: 4.51 10*6/uL (ref 3.70–5.45)
RDW: 13.1 % (ref 11.2–14.5)
WBC: 4.1 10*3/uL (ref 3.9–10.3)
lymph#: 1.1 10*3/uL (ref 0.9–3.3)

## 2014-08-27 LAB — PROTIME-INR
INR: 2.4 (ref 2.00–3.50)
Protime: 28.8 Seconds — ABNORMAL HIGH (ref 10.6–13.4)

## 2014-08-27 LAB — POCT INR: INR: 2.4

## 2014-08-27 NOTE — Progress Notes (Signed)
Pt seen in clinic today She remains therapeutic at 2.4 (goal 2-3) We see her about every 2-3 months No changes to report Only meds still ASA and coumadin APAP prn for h/a No missed doses, diet consistent, no other changes to report Last appmt with Dr. Reece AgarG was Oct 15 CBC done today and results to Dr. Reece AgarG  Continue 7.5 mg daily except 5 mg on MWF. Return on 11/21/14: Lab at 8:30am and coumadin clinic at 8:45am

## 2014-08-27 NOTE — Patient Instructions (Signed)
Continue 7.5 mg daily except 5 mg on MWF. Return on 11/21/14: Lab at 8:30am and coumadin clinic at 8:45am

## 2014-08-28 ENCOUNTER — Ambulatory Visit (INDEPENDENT_AMBULATORY_CARE_PROVIDER_SITE_OTHER): Payer: Commercial Managed Care - PPO

## 2014-08-28 DIAGNOSIS — R928 Other abnormal and inconclusive findings on diagnostic imaging of breast: Secondary | ICD-10-CM

## 2014-08-28 DIAGNOSIS — Z1231 Encounter for screening mammogram for malignant neoplasm of breast: Secondary | ICD-10-CM

## 2014-08-30 ENCOUNTER — Encounter: Payer: Self-pay | Admitting: Physician Assistant

## 2014-08-30 ENCOUNTER — Ambulatory Visit (INDEPENDENT_AMBULATORY_CARE_PROVIDER_SITE_OTHER): Payer: Commercial Managed Care - PPO | Admitting: Physician Assistant

## 2014-08-30 ENCOUNTER — Other Ambulatory Visit: Payer: Self-pay | Admitting: Physician Assistant

## 2014-08-30 VITALS — BP 122/72 | HR 60 | Ht 66.0 in | Wt 201.0 lb

## 2014-08-30 DIAGNOSIS — I82403 Acute embolism and thrombosis of unspecified deep veins of lower extremity, bilateral: Secondary | ICD-10-CM

## 2014-08-30 DIAGNOSIS — Z0000 Encounter for general adult medical examination without abnormal findings: Secondary | ICD-10-CM

## 2014-08-30 DIAGNOSIS — R928 Other abnormal and inconclusive findings on diagnostic imaging of breast: Secondary | ICD-10-CM

## 2014-08-30 DIAGNOSIS — Z86711 Personal history of pulmonary embolism: Secondary | ICD-10-CM | POA: Diagnosis not present

## 2014-08-30 DIAGNOSIS — Z13220 Encounter for screening for lipoid disorders: Secondary | ICD-10-CM

## 2014-08-30 NOTE — Progress Notes (Signed)
   Subjective:    Patient ID: Sarah Harrington, female    DOB: 05/30/1966, 49 y.o.   MRN: [id number]  HPI Pt presents to the clinic to establish care.   .. Active Ambulatory Problems    Diagnosis Date Noted  . OTHER AND UNSPECIFIED COAGULATION DEFECTS 01/26/2010  . CONTACT DERMATITIS&OTHER ECZEMA DUE UNSPEC CAUSE 12/10/2009  . EDEMA 01/26/2010  . Antiphospholipid antibody syndrome 05/18/2011  . DVT (deep venous thrombosis) 05/18/2011  . Antiphospholipid syndrome 06/18/2011  . History of pulmonary embolus (PE) 02/13/2012  . Abnormal mammogram of left breast 08/30/2014   Resolved Ambulatory Problems    Diagnosis Date Noted  . No Resolved Ambulatory Problems   No Additional Past Medical History   .[name]. Family History  Problem Relation Age of Onset  . Hyperlipidemia Father   . Cancer Maternal Grandfather     prostate  . Cancer Paternal Grandmother     breast  . Heart attack Paternal Grandfather    .[name]. History   Social History  . Marital Status: Married    Spouse Name: N/A  . Number of Children: N/A  . Years of Education: N/A   Occupational History  . Not on file.   Social History Main Topics  . Smoking status: Never Smoker   . Smokeless tobacco: Not on file  . Alcohol Use: Yes  . Drug Use: No  . Sexual Activity: Yes   Other Topics Concern  . Not on file   Social History Narrative   No problems or concerns today.    Review of Systems  All other systems reviewed and are negative.      Objective:   Physical Exam  Constitutional: She is oriented to person, place, and time. She appears well-developed and well-nourished.  HENT:  Head: Atraumatic.  Cardiovascular: Normal rate, regular rhythm and normal heart sounds.   Pulmonary/Chest: Effort normal and breath sounds normal. She has no wheezes.  Neurological: She is alert and oriented to person, place, and time.  Skin: Skin is dry.  Psychiatric: She has a normal mood and affect. Her behavior is normal.           Assessment & Plan:  DVT/PE- managed by hematology. Urged her to have conversation about newer drugs other than coumadin that could eliminate some monitoring and prevent blood clots.   Screening for lipids- ordered today.   Needs CPE. Will consider Tdap at that time. Screening for diabetes was negative.   Abnormal mammogram- just got results this morning. Showed possible left breast mass. Will get further imaging.

## 2014-09-09 ENCOUNTER — Ambulatory Visit
Admission: RE | Admit: 2014-09-09 | Discharge: 2014-09-09 | Disposition: A | Discharge status: Home / Self Care | Payer: Commercial Managed Care - PPO | Source: Ambulatory Visit | Attending: Physician Assistant | Admitting: Physician Assistant

## 2014-09-09 ENCOUNTER — Other Ambulatory Visit: Payer: Self-pay | Admitting: Physician Assistant

## 2014-09-09 DIAGNOSIS — R928 Other abnormal and inconclusive findings on diagnostic imaging of breast: Secondary | ICD-10-CM

## 2014-09-26 ENCOUNTER — Telehealth: Payer: Self-pay

## 2014-09-26 ENCOUNTER — Other Ambulatory Visit: Payer: Self-pay

## 2014-09-26 DIAGNOSIS — D6859 Other primary thrombophilia: Secondary | ICD-10-CM

## 2014-09-26 MED ORDER — WARFARIN SODIUM 5 MG PO TABS: ORAL_TABLET | ORAL | Status: DC

## 2014-09-26 NOTE — Telephone Encounter (Signed)
Refill for Coumadin 5 mg called in to pharmacy

## 2014-09-27 ENCOUNTER — Telehealth: Payer: Self-pay | Admitting: Pharmacist

## 2014-09-27 NOTE — Telephone Encounter (Signed)
Pt left vm on [facility] Coumadin clinic phone requesting refill on Coumadin 5 mg tabs. It appeared a phone-in refill was authorized yesterday to [facility] but when I called that pharmacy, they said they didn't get authorization for the RX.  I gave the v.o. Over phone x 3 month supply w/ 1 RF. I left vm for pt on her mobile to notify her the RX was phoned in for her. Ebony HailGinna Shanyia Stines, Pharm.D., CPP 09/27/2014@12 :45 PM

## 2014-11-21 ENCOUNTER — Telehealth: Payer: Self-pay | Admitting: Pharmacist

## 2014-11-21 ENCOUNTER — Ambulatory Visit: Payer: Commercial Managed Care - PPO

## 2014-11-21 ENCOUNTER — Other Ambulatory Visit: Payer: Commercial Managed Care - PPO

## 2014-11-21 NOTE — Telephone Encounter (Signed)
Pt FTKA today for lab and coumadin clinic. Called pt and left VM for pt to call back and reschedule missed appointments  Thank you,  Christell Faithhris Raylie Maddison, PharmD, BCOP

## 2014-12-06 ENCOUNTER — Telehealth: Payer: Self-pay | Admitting: Pharmacist

## 2014-12-06 NOTE — Telephone Encounter (Signed)
Patient left VM on coumadin clinic phone asking to reschedule a missed lab/CC appt from earlier in May.  She was R/S for 12/12/14; Lab = 8:45am Coumadin clinic = 9am  Message sent to scheduling.

## 2014-12-12 ENCOUNTER — Ambulatory Visit (HOSPITAL_BASED_OUTPATIENT_CLINIC_OR_DEPARTMENT_OTHER): Payer: Commercial Managed Care - PPO | Admitting: Pharmacist

## 2014-12-12 ENCOUNTER — Ambulatory Visit (HOSPITAL_BASED_OUTPATIENT_CLINIC_OR_DEPARTMENT_OTHER): Payer: Self-pay | Admitting: Pharmacist

## 2014-12-12 DIAGNOSIS — Z86711 Personal history of pulmonary embolism: Secondary | ICD-10-CM | POA: Diagnosis not present

## 2014-12-12 DIAGNOSIS — I82402 Acute embolism and thrombosis of unspecified deep veins of left lower extremity: Secondary | ICD-10-CM

## 2014-12-12 DIAGNOSIS — I82403 Acute embolism and thrombosis of unspecified deep veins of lower extremity, bilateral: Secondary | ICD-10-CM

## 2014-12-12 DIAGNOSIS — Z7901 Long term (current) use of anticoagulants: Secondary | ICD-10-CM

## 2014-12-12 DIAGNOSIS — D6859 Other primary thrombophilia: Secondary | ICD-10-CM

## 2014-12-12 DIAGNOSIS — D6852 Prothrombin gene mutation: Secondary | ICD-10-CM

## 2014-12-12 DIAGNOSIS — D6861 Antiphospholipid syndrome: Secondary | ICD-10-CM

## 2014-12-12 LAB — POCT INR: INR: 1.9

## 2014-12-12 LAB — PROTIME-INR
INR: 1.9 — ABNORMAL LOW (ref 2.00–3.50)
Protime: 22.8 Seconds — ABNORMAL HIGH (ref 10.6–13.4)

## 2014-12-12 MED ORDER — WARFARIN SODIUM 5 MG PO TABS: ORAL_TABLET | ORAL | Status: DC

## 2014-12-12 NOTE — Patient Instructions (Signed)
INR right at goal No changes Continue 7.5 mg daily except 5 mg on MWF. Return on 03/06/15: Lab at 8:30am and coumadin clinic at 8:45am.

## 2014-12-12 NOTE — Progress Notes (Signed)
INR right at goal today at 1.9 (goal 2-3) (Dr. Cyndie ChimeGranfortuna patient) Pt is doing well with no complaints No missed or extra doses No diet or medication changes No unusual bleeding or bruising Refills for coumadin sent into patient's pharmacy No changes Continue 7.5 mg daily except 5 mg on MWF. Return on 03/06/15: Lab at 8:30am and coumadin clinic at 8:45am.

## 2015-03-06 ENCOUNTER — Other Ambulatory Visit: Payer: Self-pay | Admitting: Oncology

## 2015-03-06 ENCOUNTER — Ambulatory Visit (HOSPITAL_BASED_OUTPATIENT_CLINIC_OR_DEPARTMENT_OTHER): Payer: Commercial Managed Care - PPO | Admitting: Pharmacist

## 2015-03-06 ENCOUNTER — Other Ambulatory Visit: Payer: Commercial Managed Care - PPO

## 2015-03-06 DIAGNOSIS — D6861 Antiphospholipid syndrome: Secondary | ICD-10-CM

## 2015-03-06 DIAGNOSIS — Z86718 Personal history of other venous thrombosis and embolism: Secondary | ICD-10-CM

## 2015-03-06 DIAGNOSIS — Z86711 Personal history of pulmonary embolism: Secondary | ICD-10-CM

## 2015-03-06 DIAGNOSIS — D6859 Other primary thrombophilia: Secondary | ICD-10-CM

## 2015-03-06 DIAGNOSIS — I82402 Acute embolism and thrombosis of unspecified deep veins of left lower extremity: Secondary | ICD-10-CM

## 2015-03-06 LAB — PROTIME-INR
INR: 2 (ref 2.00–3.50)
Protime: 24 Seconds — ABNORMAL HIGH (ref 10.6–13.4)

## 2015-03-06 LAB — POCT INR: INR: 2

## 2015-03-06 MED ORDER — WARFARIN SODIUM 5 MG PO TABS: ORAL_TABLET | ORAL | Status: DC

## 2015-03-06 NOTE — Progress Notes (Signed)
INR within goal today. Pt took coumadin as instructed. No missed or extra coumadin doses. No changes in diet or medications. No unusual bruising. No bleeding noted. No s/s of clotting noted. No upcoming planned procedures. Prescription refill called to the [facility] on [address] in  [place] (BRAND NAME ONLY). Continue 7.5 mg daily except 5 mg on MWF. Recheck INR on 04/15/15 at Dr. Patsy Lager office.  Pt has an office visit with MD that day.  This is the last [facility] coumadin clinic visit for Shann. It has been a pleasure to be involved in her care.

## 2015-03-06 NOTE — Patient Instructions (Addendum)
Continue 7.5mg  daily except  on MWF. Recheck INR on 04/15/15 at Dr. Patsy Lager office.

## 2015-04-15 ENCOUNTER — Ambulatory Visit (INDEPENDENT_AMBULATORY_CARE_PROVIDER_SITE_OTHER): Payer: Commercial Managed Care - PPO | Admitting: Oncology

## 2015-04-15 ENCOUNTER — Encounter: Payer: Self-pay | Admitting: Oncology

## 2015-04-15 VITALS — BP 130/75 | HR 62 | Temp 98.3°F | Ht 66.0 in | Wt 191.6 lb

## 2015-04-15 DIAGNOSIS — Z7901 Long term (current) use of anticoagulants: Secondary | ICD-10-CM | POA: Diagnosis not present

## 2015-04-15 DIAGNOSIS — D6861 Antiphospholipid syndrome: Secondary | ICD-10-CM

## 2015-04-15 DIAGNOSIS — I82402 Acute embolism and thrombosis of unspecified deep veins of left lower extremity: Secondary | ICD-10-CM

## 2015-04-15 DIAGNOSIS — I87009 Postthrombotic syndrome without complications of unspecified extremity: Secondary | ICD-10-CM | POA: Diagnosis not present

## 2015-04-15 DIAGNOSIS — Z86718 Personal history of other venous thrombosis and embolism: Secondary | ICD-10-CM | POA: Diagnosis not present

## 2015-04-15 DIAGNOSIS — Z86711 Personal history of pulmonary embolism: Secondary | ICD-10-CM

## 2015-04-15 LAB — PROTIME-INR
INR: 2.85 — ABNORMAL HIGH (ref 0.00–1.49)
PROTHROMBIN TIME: 29.5 s — AB (ref 11.6–15.2)

## 2015-04-15 NOTE — Patient Instructions (Signed)
To lab today PT/INR every 2 months  Chemistry profile 08/19/15 CBC every 6 months  Return visit to Dr Reece Agar in 1 year

## 2015-04-16 ENCOUNTER — Telehealth: Payer: Self-pay | Admitting: *Deleted

## 2015-04-16 LAB — COMPREHENSIVE METABOLIC PANEL
ALK PHOS: 36 IU/L — AB (ref 39–117)
ALT: 11 IU/L (ref 0–32)
AST: 13 IU/L (ref 0–40)
Albumin/Globulin Ratio: 2.1 (ref 1.1–2.5)
Albumin: 4 g/dL (ref 3.5–5.5)
BUN/Creatinine Ratio: 11 (ref 9–23)
BUN: 8 mg/dL (ref 6–24)
Bilirubin Total: 0.4 mg/dL (ref 0.0–1.2)
CO2: 22 mmol/L (ref 18–29)
CREATININE: 0.75 mg/dL (ref 0.57–1.00)
Calcium: 8.5 mg/dL — ABNORMAL LOW (ref 8.7–10.2)
Chloride: 102 mmol/L (ref 97–108)
GFR calc Af Amer: 109 mL/min/{1.73_m2} (ref >59)
GFR calc non Af Amer: 95 mL/min/{1.73_m2} (ref >59)
GLOBULIN, TOTAL: 1.9 g/dL (ref 1.5–4.5)
GLUCOSE: 98 mg/dL (ref 65–99)
Potassium: 4.3 mmol/L (ref 3.5–5.2)
SODIUM: 137 mmol/L (ref 134–144)
Total Protein: 5.9 g/dL — ABNORMAL LOW (ref 6.0–8.5)

## 2015-04-16 LAB — CBC WITH DIFFERENTIAL/PLATELET
BASOS ABS: 0 10*3/uL (ref 0.0–0.2)
Basos: 1 %
EOS (ABSOLUTE): 0.1 10*3/uL (ref 0.0–0.4)
Eos: 1 %
Hematocrit: 42.9 % (ref 34.0–46.6)
Hemoglobin: 13.7 g/dL (ref 11.1–15.9)
Immature Grans (Abs): 0 10*3/uL (ref 0.0–0.1)
Immature Granulocytes: 0 %
LYMPHS ABS: 1.4 10*3/uL (ref 0.7–3.1)
Lymphs: 24 %
MCH: 31.1 pg (ref 26.6–33.0)
MCHC: 31.9 g/dL (ref 31.5–35.7)
MCV: 98 fL — ABNORMAL HIGH (ref 79–97)
MONOS ABS: 0.4 10*3/uL (ref 0.1–0.9)
Monocytes: 7 %
Neutrophils Absolute: 4 10*3/uL (ref 1.4–7.0)
Neutrophils: 67 %
Platelets: 317 10*3/uL (ref 150–379)
RBC: 4.4 x10E6/uL (ref 3.77–5.28)
RDW: 13.9 % (ref 12.3–15.4)
WBC: 6 10*3/uL (ref 3.4–10.8)

## 2015-04-16 NOTE — Telephone Encounter (Signed)
-----   Message from Levert Feinstein, MD sent at 04/16/2015 10:07 AM EDT ----- Call pt: lab all OK

## 2015-04-16 NOTE — Telephone Encounter (Signed)
Pt called / informed her labs are all ok per Dr Cyndie Chime. No questions.

## 2015-04-16 NOTE — Progress Notes (Signed)
Patient ID: Sarah Harrington, female   DOB: Dec 16, 1965, 49 y.o.   MRN: [id number] Hematology and Oncology Follow Up Visit  Sarah Harrington [id number] 10-20-1965 49 y.o. 04/16/2015 9:00 AM   Principle Diagnosis: Encounter Diagnoses  Name Primary?  [name] Antiphospholipid antibody syndrome (HCC) Yes  . Deep vein thrombosis (DVT) of left lower extremity, unspecified chronicity, unspecified vein (HCC)   . Antiphospholipid syndrome (HCC)   . History of pulmonary embolus (PE)   . 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 She was also on oral contraceptives. 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 . The lupus anticoagulant has been variably positive over the years. The IgA antibody has been persistently elevated. I stopped her anticoagulation on 08/01/06 and just put her on one aspirin a day. Unfortunately, she developed left calf pain and swelling and was evaluated on 08/31/07 with findings of extensive superficial thrombosis of the greater saphenous vein with calf veins also being thrombosed. She was put back on full dose anticoagulation at that time and has had no subsequent thrombotic events.  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 A follow-up Doppler study done on 10/28/08 did not show any recurrent clot at that time. D-dimer testing done 08/31/07 remained elevated at 1.73, normal less than 0.48 but a follow-up study done 10/28/08 had normalized down to 0.22. 0.11 on 11/19/09. She  had laser vein surgery with a good outcome.  She remains on warfarin and is now followed at the [facility] Coumadin clinic with lab checks every other month. Current  Coumadin dose 7.5 mg daily except 5 mg Monday  Wednesdays and Fridays. Questionable mass left breast seen on a annual mammogram 08/28/2014 not confirmed with follow-up ultrasound and breast tomography done February 29. This showed a 1.6 cm benign-appearing cyst left breast 3:30 position. Next mammogram recommended in one year. She has had no dyspnea, chest pain, palpitations, Swelling or pain. She is still working full-time in her profession. She has 3 children.   Interim History:   She is doing well. No interim medical problems.  Medications: reviewed  Allergies: No Known Allergies  Review of Systems: See history of present illness Remaining ROS negative:   Physical Exam: Blood pressure 130/75, pulse 62, temperature 98.3 F (36.8 C), temperature source Oral, height  (1.676 m), weight 191 lb 9.6 oz (86.909 kg), SpO2 99 %. Wt Readings from Last 3 Encounters:  04/15/15 191 lb 9.6 oz (86.909 kg)  08/30/14 201 lb (91.173 kg)  05/07/14 203 lb 8 oz (92.307 kg)     General appearance: Well-nourished Caucasian woman HENNT: Pharynx no erythema, exudate, mass, or ulcer. No thyromegaly or thyroid nodules Lymph nodes: No cervical, supraclavicular, or axillary lymphadenopathy Breasts:  Lungs: Clear to auscultation, resonant to percussion throughout Heart: Regular rhythm, no murmur, no gallop, no rub, no click, no edema Abdomen: Soft, nontender, normal bowel sounds, no mass, no organomegaly Extremities: No edema, no calf tenderness Musculoskeletal: no joint deformities GU:  Vascular: Carotid pulses 2+, no bruits,  Neurologic: Alert, oriented, PERRLA, cranial nerves grossly normal, motor strength 5 over 5, reflexes 1+ symmetric, upper body coordination normal, gait normal, Skin: No rash or ecchymosis  Lab Results: CBC W/Diff  Component Value Date/Time   WBC 4.1 08/27/2014 0834   RBC 4.51 08/27/2014 0834   HGB 13.9 08/27/2014 0834   HCT 42.3 08/27/2014 0834   PLT 285 08/27/2014 0834   MCV 93.8 08/27/2014 0834   MCH 30.8  08/27/2014 0834   MCHC 32.9 08/27/2014 0834   RDW 13.1 08/27/2014 0834   LYMPHSABS 1.1 08/27/2014 0834   MONOABS 0.3 08/27/2014 0834   EOSABS 0.1 08/27/2014 0834   BASOSABS 0.0 08/27/2014 0834     Chemistry      Component Value Date/Time   NA 137 04/15/2015 0938   NA 138 08/27/2014 0838   NA 136 01/28/2011 0812   K 4.3 04/15/2015 0938   K 4.1 08/27/2014 0838   CL 102 04/15/2015 0938   CO2 22 04/15/2015 0938   CO2 22 08/27/2014 0838   BUN 8 04/15/2015 0938   BUN 10.5 08/27/2014 0838   BUN 10 01/28/2011 0812   CREATININE 0.75 04/15/2015 0938   CREATININE 0.7 08/27/2014 0838      Component Value Date/Time   CALCIUM 8.5* 04/15/2015 0938   CALCIUM 8.6 08/27/2014 0838   ALKPHOS 36* 04/15/2015 0938   ALKPHOS 34* 08/27/2014 0838   AST 13 04/15/2015 0938   AST 15 08/27/2014 0838   ALT 11 04/15/2015 0938   ALT 13 08/27/2014 0838   BILITOT 0.4 04/15/2015 0938   BILITOT 0.21 08/27/2014 0838   BILITOT 0.4 01/28/2011 4098       Radiological Studies: No results found.  Impression:  #1. 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 Chronic warfarin anticoagulation secondary to #1 and 2. We are monitoring PT/INR every 2-3 months. 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   CC: Patient Care Team: Sandford Craze, NP as PCP - General   Levert Feinstein, MD 10/5/20169:00 AM

## 2015-06-16 ENCOUNTER — Encounter: Payer: Self-pay | Admitting: Pharmacist

## 2015-06-16 ENCOUNTER — Telehealth: Payer: Self-pay | Admitting: Pharmacist

## 2015-06-16 ENCOUNTER — Other Ambulatory Visit (INDEPENDENT_AMBULATORY_CARE_PROVIDER_SITE_OTHER): Payer: Commercial Managed Care - PPO

## 2015-06-16 ENCOUNTER — Ambulatory Visit: Payer: Commercial Managed Care - PPO

## 2015-06-16 DIAGNOSIS — D6861 Antiphospholipid syndrome: Secondary | ICD-10-CM | POA: Diagnosis not present

## 2015-06-16 DIAGNOSIS — I82402 Acute embolism and thrombosis of unspecified deep veins of left lower extremity: Secondary | ICD-10-CM

## 2015-06-16 DIAGNOSIS — Z86711 Personal history of pulmonary embolism: Secondary | ICD-10-CM

## 2015-06-16 LAB — PROTIME-INR
INR: 2.56 — AB (ref 0.00–1.49)
PROTHROMBIN TIME: 27.1 s — AB (ref 11.6–15.2)

## 2015-06-16 NOTE — Telephone Encounter (Addendum)
Anticoagulation Management Sarah ReilSusan M Harrington is a 49 y.o. female who was contacted for monitoring of warfarin treatment.    Indication: DVT and antiphospholipid antibody syndrome Duration: indefinite  ASSESSMENT Recent Results: Recent results are below, the most recent result is correlated with a dose of 45 mg per week: Lab Results  Component Value Date   INR 2.56* 06/16/2015   INR 2.85* 04/15/2015   INR 2.00 03/06/2015   PROTIME 24.0* 03/06/2015    INR today: Therapeutic; patient reports no signs/symptoms of bleeding or thromboembolism  Anticoagulation Dosing: INR as of 03/06/2015 and Previous Dosing Information    INR Dt INR Goal Wkly Tot [name]   03/06/2015 2.00 2.0-3.0 45 mg 7.5 mg 5 mg 7.5 mg 5 mg 7.5 mg 5 mg 7.5 mg    Previous description        Continue 7.5 mg daily except 5 mg on MWF. Return on 03/06/15: Lab at 8:30am and coumadin clinic at 8:45am.    Anticoagulation Dose Instructions as of 03/06/2015      Total [name]   New Dose 45 mg 7.5 mg 5 mg 7.5 mg 5 mg 7.5 mg 5 mg 7.5 mg     (5 mg x 1.5)  (5 mg x 1)  (5 mg x 1.5)  (5 mg x 1)  (5 mg x 1.5)  (5 mg x 1)  (5 mg x 1.5)                         Description        Continue 7.5mg  daily except 5mg  on MWF. Recheck INR on 04/15/15 at Dr. Patsy LagerGranfortuna's office.        PLAN Weekly dose was unchanged. Unable to reach patient, left message.  Follow-up 4 weeks  Karime Scheuermann J

## 2015-07-10 ENCOUNTER — Telehealth: Payer: Self-pay | Admitting: Pharmacist

## 2015-07-10 NOTE — Telephone Encounter (Signed)
Patient was called regarding her next scheduled clinic day/date (which was a CLOSED clinic day/date). [facility] HER REQUEST of "3 month return to clinic appoinments[facility] She was reminded that it is incumbent upon her--in the instance of any signs or symptoms of bleeding or clotting--to [facility] or report to the ED. We discussed that the CHEST Guidelines are indeed permissive of 12 week RTC intervals in stable patients.

## 2015-09-08 ENCOUNTER — Ambulatory Visit (INDEPENDENT_AMBULATORY_CARE_PROVIDER_SITE_OTHER): Payer: Commercial Managed Care - PPO | Admitting: Pharmacist

## 2015-09-08 DIAGNOSIS — Z7901 Long term (current) use of anticoagulants: Secondary | ICD-10-CM

## 2015-09-08 DIAGNOSIS — I82401 Acute embolism and thrombosis of unspecified deep veins of right lower extremity: Secondary | ICD-10-CM

## 2015-09-08 DIAGNOSIS — D6861 Antiphospholipid syndrome: Secondary | ICD-10-CM

## 2015-09-08 LAB — POCT INR: INR: 2.5

## 2015-09-08 NOTE — Patient Instructions (Signed)
Patient instructed to take medications as defined in the Anti-coagulation Track section of this encounter.  Patient instructed to take today's dose.  Patient verbalized understanding of these instructions.    

## 2015-09-08 NOTE — Progress Notes (Signed)
Anti-Coagulation Progress Note  Sarah Harrington is a 50 y.o. female who is currently on an anti-coagulation regimen.    RECENT RESULTS: Recent results are below, the most recent result is correlated with a dose of 47.5 mg. per week:  With "advice and consent" of the patient--she elects to continue an every 12 week re-call, now cited as a suggestion in the supplement to the journal CHEST for patient's whose INR response is stable. She was reminded in the event of any signs or symptoms of recurrent of VTE or signs or symptoms that would suggest bleeding--to call me, clinic or report to the ED as her symptoms might warrant.  Lab Results  Component Value Date   INR 2.50 09/08/2015   INR 2.56* 06/16/2015   INR 2.85* 04/15/2015   PROTIME 24.0* 03/06/2015    ANTI-COAG DOSE: Anticoagulation Dose Instructions as of 09/08/2015      [name] Tue Wed Thu Fri Sat   New Dose 7.5 mg 5 mg 7.5 mg 5 mg 7.5 mg 5 mg 7.5 mg       ANTICOAG SUMMARY: Anticoagulation Episode Summary    Current INR goal 2.0-3.0  Next INR check 12/01/2015  INR from last check 2.50 (09/08/2015)  Weekly max dose   Target end date   INR check location   Preferred lab   Send INR reminders to [facility]   Indications  Antiphospholipid syndrome (HCC) [D68.61] DVT (deep venous thrombosis) (HCC) [I82.409]        Comments       Anticoagulation Care Providers    Provider Role Specialty Phone number   Levert Feinstein, MD Referring Oncology [phone]      ANTICOAG TODAY: Anticoagulation Summary as of 09/08/2015    INR goal 2.0-3.0  Selected INR 2.50 (09/08/2015)  Next INR check 12/01/2015  Target end date    Indications  Antiphospholipid syndrome (HCC) [D68.61] DVT (deep venous thrombosis) (HCC) [I82.409]      Anticoagulation Episode Summary    INR check location    Preferred lab    Send INR reminders to [facility]   Comments     Anticoagulation Care Providers    Provider Role Specialty  Phone number   Levert Feinstein, MD Referring Oncology [phone]      PATIENT INSTRUCTIONS: Patient Instructions  Patient instructed to take medications as defined in the Anti-coagulation Track section of this encounter.  Patient instructed to take today's dose.  Patient verbalized understanding of these instructions.       FOLLOW-UP Return in about 3 months (around 12/01/2015) for Follow up INR at 0830h.  Hulen Luster, III Pharm.D., CACP

## 2015-09-08 NOTE — Progress Notes (Signed)
Reviewed Thanks DrG 

## 2015-11-04 ENCOUNTER — Encounter: Payer: Self-pay | Admitting: Physician Assistant

## 2015-11-04 ENCOUNTER — Ambulatory Visit (INDEPENDENT_AMBULATORY_CARE_PROVIDER_SITE_OTHER): Payer: Commercial Managed Care - PPO | Admitting: Physician Assistant

## 2015-11-04 VITALS — BP 124/63 | HR 60 | Temp 98.6°F | Ht 66.0 in | Wt 175.0 lb

## 2015-11-04 DIAGNOSIS — J0100 Acute maxillary sinusitis, unspecified: Secondary | ICD-10-CM

## 2015-11-04 MED ORDER — AMOXICILLIN-POT CLAVULANATE 875-125 MG PO TABS: 1.0000 | ORAL_TABLET | Freq: Two times a day (BID) | ORAL | Status: DC

## 2015-11-04 NOTE — Patient Instructions (Signed)

## 2015-11-04 NOTE — Progress Notes (Addendum)
   Subjective:    Patient ID: Sarah Harrington, female    DOB: 12/11/1965, 50 y.o.   MRN: [id number]  HPI Patient presents today complaining of congestion and sore throat x1 week. She states that symptoms have gotten worse. She also developed a productive cough 3 days ago and left ear pain 1 day ago. Denies fever, chills, rigors, nausea, and vomiting. She has taken Dayquil and Nyquil with little relief. Denies ill contacts. She is having sinus pressure and congestion.    Review of Systems  Constitutional: Negative for fever.  HENT: Positive for congestion, ear pain (left) and sore throat.   Respiratory: Positive for cough. Negative for chest tightness, wheezing and stridor.        Objective:   Physical Exam  Constitutional: She appears well-developed and well-nourished.  HENT:  Head: Normocephalic and atraumatic.  Right Ear: Tympanic membrane is not erythematous and not bulging.  Left Ear: Tympanic membrane is bulging. Tympanic membrane is not erythematous.  Nose: Right sinus exhibits frontal sinus tenderness. Right sinus exhibits no maxillary sinus tenderness. Left sinus exhibits frontal sinus tenderness. Left sinus exhibits no maxillary sinus tenderness.  Mouth/Throat: Posterior oropharyngeal erythema present.  Cardiovascular: Normal rate, regular rhythm and normal heart sounds.   Pulmonary/Chest: Effort normal and breath sounds normal.  Lymphadenopathy:    She has cervical adenopathy.          Assessment & Plan:  1. Sinusitis- Patient presents with worsening symptoms of congestion, sore throat, and cough x1 week. On PE, yellow drainage visualized on the nasal mucosa with erythematous oropharyngeal mucosa and tenderness to palpation of the frontal sinuses. Serous fluid visualized behind the left TM with mild bulging of the TM. Will treat with Augmentin. Encouraged use of OTC flonase as well.

## 2015-11-28 ENCOUNTER — Telehealth: Payer: Self-pay | Admitting: Physician Assistant

## 2015-11-28 NOTE — Telephone Encounter (Signed)
APT. REMINDER CALL, LMTCB °

## 2015-12-01 ENCOUNTER — Other Ambulatory Visit: Payer: Commercial Managed Care - PPO

## 2015-12-01 ENCOUNTER — Ambulatory Visit: Payer: Commercial Managed Care - PPO

## 2015-12-15 ENCOUNTER — Ambulatory Visit (INDEPENDENT_AMBULATORY_CARE_PROVIDER_SITE_OTHER): Payer: Commercial Managed Care - PPO | Admitting: Pharmacist

## 2015-12-15 ENCOUNTER — Other Ambulatory Visit (INDEPENDENT_AMBULATORY_CARE_PROVIDER_SITE_OTHER): Payer: Commercial Managed Care - PPO

## 2015-12-15 DIAGNOSIS — I82419 Acute embolism and thrombosis of unspecified femoral vein: Secondary | ICD-10-CM

## 2015-12-15 DIAGNOSIS — Z7901 Long term (current) use of anticoagulants: Secondary | ICD-10-CM

## 2015-12-15 DIAGNOSIS — Z86711 Personal history of pulmonary embolism: Secondary | ICD-10-CM

## 2015-12-15 DIAGNOSIS — D6861 Antiphospholipid syndrome: Secondary | ICD-10-CM

## 2015-12-15 DIAGNOSIS — I82402 Acute embolism and thrombosis of unspecified deep veins of left lower extremity: Secondary | ICD-10-CM

## 2015-12-15 LAB — POCT INR: INR: 2.8

## 2015-12-15 NOTE — Progress Notes (Signed)
Anti-Coagulation Progress Note  Toney ReilSusan M Votaw is a 50 y.o. female who is currently on an anti-coagulation regimen.    RECENT RESULTS: Recent results are below, the most recent result is correlated with a dose of 45 mg. per week: Lab Results  Component Value Date   INR 2.80 12/15/2015   INR 2.50 09/08/2015   INR 2.56* 06/16/2015   PROTIME 24.0* 03/06/2015    ANTI-COAG DOSE: Anticoagulation Dose Instructions as of 12/15/2015      [name] Mon Tue Wed Thu Fri Sat   New Dose 7.5 mg 5 mg 7.5 mg 5 mg 7.5 mg 5 mg 7.5 mg       ANTICOAG SUMMARY: Anticoagulation Episode Summary    Current INR goal 2.0-3.0  Next INR check 03/08/2016  INR from last check 2.80 (12/15/2015)  Weekly max dose   Target end date   INR check location   Preferred lab   Send INR reminders to [facility]   Indications  Antiphospholipid syndrome (HCC) [D68.61] DVT (deep venous thrombosis) (HCC) [I82.409]        Comments       Anticoagulation Care Providers    Provider Role Specialty Phone number   Levert FeinsteinJames M Granfortuna, MD Referring Oncology [phone]      ANTICOAG TODAY: Anticoagulation Summary as of 12/15/2015    INR goal 2.0-3.0  Selected INR 2.80 (12/15/2015)  Next INR check 03/08/2016  Target end date    Indications  Antiphospholipid syndrome (HCC) [D68.61] DVT (deep venous thrombosis) (HCC) [I82.409]      Anticoagulation Episode Summary    INR check location    Preferred lab    [facility]   Comments     Anticoagulation Care Providers    Provider Role Specialty Phone number   Levert FeinsteinJames M Granfortuna, MD Referring Oncology [phone]      PATIENT INSTRUCTIONS: Patient Instructions  Patient instructed to take medications as defined in the Anti-coagulation Track section of this encounter.  Patient instructed to take today's dose.  Patient verbalized understanding of these instructions.       FOLLOW-UP Return in 3 months (on 03/08/2016) for Follow up  INR at 1015h.  Hulen LusterJames Enedina Pair, III Pharm.D., CACP

## 2015-12-15 NOTE — Patient Instructions (Signed)
Patient instructed to take medications as defined in the Anti-coagulation Track section of this encounter.  Patient instructed to take today's dose.  Patient verbalized understanding of these instructions.    

## 2015-12-15 NOTE — Progress Notes (Signed)
INTERNAL MEDICINE TEACHING ATTENDING ADDENDUM - Sarah LagosNischal Mckinzi Harrington M.D  Duration- indefinite, Indication- PE, DVT, APL syndrome, INR- therapeutic. Agree with pharmacy recommendations as outlined in their note.

## 2015-12-16 LAB — COMPREHENSIVE METABOLIC PANEL
A/G RATIO: 1.7 (ref 1.2–2.2)
ALK PHOS: 36 IU/L — AB (ref 39–117)
ALT: 13 IU/L (ref 0–32)
AST: 16 IU/L (ref 0–40)
Albumin: 3.8 g/dL (ref 3.5–5.5)
BUN/Creatinine Ratio: 15 (ref 9–23)
BUN: 10 mg/dL (ref 6–24)
Bilirubin Total: <0.2 mg/dL (ref 0.0–1.2)
CHLORIDE: 103 mmol/L (ref 96–106)
CO2: 22 mmol/L (ref 18–29)
Calcium: 8.6 mg/dL — ABNORMAL LOW (ref 8.7–10.2)
Creatinine, Ser: 0.67 mg/dL (ref 0.57–1.00)
GFR calc Af Amer: 119 mL/min/{1.73_m2} (ref >59)
GFR calc non Af Amer: 104 mL/min/{1.73_m2} (ref >59)
GLOBULIN, TOTAL: 2.2 g/dL (ref 1.5–4.5)
Glucose: 86 mg/dL (ref 65–99)
POTASSIUM: 4.2 mmol/L (ref 3.5–5.2)
SODIUM: 136 mmol/L (ref 134–144)
Total Protein: 6 g/dL (ref 6.0–8.5)

## 2015-12-16 LAB — CBC WITH DIFFERENTIAL/PLATELET
BASOS: 1 %
Basophils Absolute: 0 10*3/uL (ref 0.0–0.2)
EOS (ABSOLUTE): 0.1 10*3/uL (ref 0.0–0.4)
EOS: 2 %
HEMATOCRIT: 39.8 % (ref 34.0–46.6)
Hemoglobin: 13.3 g/dL (ref 11.1–15.9)
Immature Grans (Abs): 0 10*3/uL (ref 0.0–0.1)
Immature Granulocytes: 0 %
LYMPHS ABS: 1.7 10*3/uL (ref 0.7–3.1)
Lymphs: 31 %
MCH: 31.2 pg (ref 26.6–33.0)
MCHC: 33.4 g/dL (ref 31.5–35.7)
MCV: 93 fL (ref 79–97)
MONOS ABS: 0.5 10*3/uL (ref 0.1–0.9)
Monocytes: 9 %
Neutrophils Absolute: 3.2 10*3/uL (ref 1.4–7.0)
Neutrophils: 57 %
PLATELETS: 334 10*3/uL (ref 150–379)
RBC: 4.26 x10E6/uL (ref 3.77–5.28)
RDW: 13.6 % (ref 12.3–15.4)
WBC: 5.4 10*3/uL (ref 3.4–10.8)

## 2015-12-30 ENCOUNTER — Other Ambulatory Visit: Payer: Self-pay | Admitting: Oncology

## 2016-04-15 ENCOUNTER — Telehealth: Payer: Self-pay | Admitting: Physician Assistant

## 2016-04-15 ENCOUNTER — Other Ambulatory Visit: Payer: Self-pay | Admitting: Oncology

## 2016-04-15 DIAGNOSIS — D6861 Antiphospholipid syndrome: Secondary | ICD-10-CM

## 2016-04-15 DIAGNOSIS — Z7901 Long term (current) use of anticoagulants: Secondary | ICD-10-CM

## 2016-04-15 NOTE — Telephone Encounter (Signed)
APT. REMINDER CALL, LMTCB °

## 2016-04-16 ENCOUNTER — Telehealth (INDEPENDENT_AMBULATORY_CARE_PROVIDER_SITE_OTHER): Payer: Commercial Managed Care - PPO | Admitting: Pharmacist

## 2016-04-16 ENCOUNTER — Other Ambulatory Visit (INDEPENDENT_AMBULATORY_CARE_PROVIDER_SITE_OTHER): Payer: Commercial Managed Care - PPO

## 2016-04-16 DIAGNOSIS — D6861 Antiphospholipid syndrome: Secondary | ICD-10-CM | POA: Diagnosis not present

## 2016-04-16 DIAGNOSIS — I82409 Acute embolism and thrombosis of unspecified deep veins of unspecified lower extremity: Secondary | ICD-10-CM | POA: Diagnosis not present

## 2016-04-16 DIAGNOSIS — Z7901 Long term (current) use of anticoagulants: Secondary | ICD-10-CM

## 2016-04-16 DIAGNOSIS — I82419 Acute embolism and thrombosis of unspecified femoral vein: Secondary | ICD-10-CM

## 2016-04-16 LAB — POCT INR: INR: 1.6

## 2016-04-16 NOTE — Patient Instructions (Signed)
Patient educated about medication as defined in this encounter and verbalized understanding by repeating back instructions provided.   

## 2016-04-16 NOTE — Progress Notes (Signed)
Anticoagulation Management Sarah Harrington is a 50 y.o. female who was contacted for monitoring of warfarin treatment.    Indication: DVT, PE and antiphospholipid antibody syndrome  Duration: indefinite  Anticoagulation Clinic Visit History: Patient does not report signs/symptoms of bleeding or thromboembolism   Anticoagulation Episode Summary    Current INR goal:   2.0-3.0  TTR:   73.1 % (4.8 y)  Next INR check:   04/26/2016  INR from last check:   1.6! (04/16/2016)  Weekly max dose:     Target end date:     INR check location:     Preferred lab:     Send INR reminders to:   [facility]   Indications   Antiphospholipid syndrome (HCC) [D68.61] DVT (deep venous thrombosis) (HCC) [I82.409]       Comments:         Anticoagulation Care Providers    Provider Role Specialty Phone number   Levert FeinsteinJames M Granfortuna, MD Referring Oncology [phone]     ASSESSMENT Recent Results: The most recent result is correlated with 45 mg per week: Lab Results  Component Value Date   INR 1.6 04/16/2016   INR 2.80 12/15/2015   INR 2.50 09/08/2015   PROTIME 24.0 (H) 03/06/2015   Anticoagulation Dosing: INR as of 04/16/2016 and Previous Dosing Information    INR Dt INR Goal [facility] Tot [name]   04/16/2016 1.6 2.0-3.0 45 mg 7.5 mg 5 mg 7.5 mg 5 mg 7.5 mg 5 mg 7.5 mg    Anticoagulation Dose Instructions as of 04/16/2016      Total [name]   New Dose 45 mg 7.5 mg 5 mg 7.5 mg 5 mg 7.5 mg 5 mg 7.5 mg     (5 mg x 1.5)  (5 mg x 1)  (5 mg x 1.5)  (5 mg x 1)  (5 mg x 1.5)  (5 mg x 1)  (5 mg x 1.5)                         Description   Take 1 extra tablet today (2 tablets total), resume weekly dose at 45 mg/week     INR today: Subtherapeutic  PLAN Weekly dose was unchanged, however advised patient to take an extra tablet today (total 10 mg dose today)   Patient Instructions  Patient educated about medication as defined in this encounter  and verbalized understanding by repeating back instructions provided.   Patient advised to contact clinic or seek medical attention if signs/symptoms of bleeding or thromboembolism occur.  Patient verbalized understanding by repeating back information and was advised to contact me if further medication-related questions arise. Patient was also provided an information handout.  Follow-up Return in about 10 days (around 04/26/2016).  Sarah Harrington

## 2016-04-16 NOTE — Telephone Encounter (Signed)
Reviewed & discussed with you We can defer bridge; just increase warfarin dose; repeat lab 1 week

## 2016-04-26 ENCOUNTER — Ambulatory Visit (INDEPENDENT_AMBULATORY_CARE_PROVIDER_SITE_OTHER): Payer: Commercial Managed Care - PPO | Admitting: Oncology

## 2016-04-26 ENCOUNTER — Encounter: Payer: Self-pay | Admitting: Oncology

## 2016-04-26 ENCOUNTER — Ambulatory Visit (INDEPENDENT_AMBULATORY_CARE_PROVIDER_SITE_OTHER): Payer: Commercial Managed Care - PPO | Admitting: Pharmacist

## 2016-04-26 VITALS — BP 114/76 | HR 54 | Temp 98.0°F | Ht 66.0 in | Wt 185.2 lb

## 2016-04-26 DIAGNOSIS — D6861 Antiphospholipid syndrome: Secondary | ICD-10-CM | POA: Diagnosis not present

## 2016-04-26 DIAGNOSIS — I82409 Acute embolism and thrombosis of unspecified deep veins of unspecified lower extremity: Secondary | ICD-10-CM

## 2016-04-26 DIAGNOSIS — Z7901 Long term (current) use of anticoagulants: Secondary | ICD-10-CM | POA: Diagnosis not present

## 2016-04-26 DIAGNOSIS — I82402 Acute embolism and thrombosis of unspecified deep veins of left lower extremity: Secondary | ICD-10-CM

## 2016-04-26 DIAGNOSIS — I87002 Postthrombotic syndrome without complications of left lower extremity: Secondary | ICD-10-CM

## 2016-04-26 DIAGNOSIS — Z86718 Personal history of other venous thrombosis and embolism: Secondary | ICD-10-CM

## 2016-04-26 DIAGNOSIS — Z86711 Personal history of pulmonary embolism: Secondary | ICD-10-CM

## 2016-04-26 LAB — POCT INR: INR: 3.1

## 2016-04-26 NOTE — Progress Notes (Signed)
Hematology and Oncology Follow Up Visit  Sarah ReilSusan M Harrington [id number] 07/26/1965 50 y.o. 04/26/2016 10:27 AM   Principle Diagnosis: Encounter Diagnoses  Name Primary?  [name]. Antiphospholipid antibody syndrome (HCC) Yes  . History of pulmonary embolus (PE)   . 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 She was also on oral contraceptives. 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 The lupus anticoagulant has been variably positive over the years. The IgA antibody has been persistently elevated. I stopped her anticoagulation on 08/01/06 and just put her on one aspirin a day. Unfortunately, she developed left calf pain and swelling and was evaluated on 08/31/07 with findings of extensive superficial thrombosis of the greater saphenous vein with calf veins also being thrombosed. She was put back on full dose anticoagulation at that time and has had no subsequent thrombotic events.  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 A follow-up Doppler study done on 10/28/08 did not show any recurrent clot at that time. D-dimer testing done 08/31/07 remained elevated at 1.73, normal less than 0.48 but a follow-up study done 10/28/08 had normalized down to 0.22., 0.11 on 11/19/09. She  had laser vein surgery in the past with a good outcome.   Interim History:   She is doing well. No interim medical problems. She denies any dyspnea, chest pain, palpitations. She does get intermittent swelling of her left calf if she is on her feet too long. No neurologic signs or symptoms. She was in for a routine INR check on October 6. She was subtherapeutic. She has been on a very  steady dose of Coumadin for many years. She had no change in diet. No new medications. She was given 1 additional dose and then told to go back on her regular Ailey dose. Repeat INR today is 3.1. Her family is doing well. She has a son in college. One child in high school. One still in elementary school.  Medications: reviewed  Allergies: No Known Allergies  Review of Systems: See interim history. Remaining ROS negative:   Physical Exam: Blood pressure 114/76, pulse (!) 54, temperature 98 F (36.7 C), temperature source Oral, height 5\' 6"  (1.676 m), weight 185 lb 3.2 oz (84 kg), SpO2 100 %. Wt Readings from Last 3 Encounters:  04/26/16 185 lb 3.2 oz (84 kg)  11/04/15 175 lb (79.4 kg)  04/15/15 191 lb 9.6 oz (86.9 kg)     General appearance:  Well nourished Caucasian woman HENNT: Pharynx no erythema, exudate, mass, or ulcer. No thyromegaly or thyroid nodules Lymph nodes: No cervical, supraclavicular, or axillary lymphadenopathy Breasts:  Lungs: Clear to auscultation, resonant to percussion throughout Heart: Regular rhythm, no murmur, no gallop, no rub, no click, no edema Abdomen: Soft, nontender, normal bowel sounds, no mass, no organomegaly Extremities: No edema, no calf tenderness Musculoskeletal: no joint deformities Left calf 42 cm, right 41 cm GU:  Vascular: Carotid pulses 2+, no bruits,  Neurologic: Alert, oriented, PERRLA, optic discs sharp and vessels normal, no hemorrhage or exudate, cranial nerves grossly normal, motor strength 5 over 5, reflexes 1+ symmetric, upper body coordination normal, gait normal, Skin: No rash or ecchymosis  Lab Results: CBC W/Diff    Component  Value Date/Time   WBC 5.4 12/15/2015 1021   WBC 4.1 08/27/2014 0834   RBC 4.26 12/15/2015 1021   RBC 4.51 08/27/2014 0834   HGB 13.9 08/27/2014 0834   HCT 39.8 12/15/2015 1021   HCT 42.3 08/27/2014 0834   PLT 334 12/15/2015 1021   MCV 93 12/15/2015 1021   MCV 93.8 08/27/2014 0834   MCH 31.2  12/15/2015 1021   MCH 30.8 08/27/2014 0834   MCHC 33.4 12/15/2015 1021   MCHC 32.9 08/27/2014 0834   RDW 13.6 12/15/2015 1021   RDW 13.1 08/27/2014 0834   LYMPHSABS 1.7 12/15/2015 1021   LYMPHSABS 1.1 08/27/2014 0834   MONOABS 0.3 08/27/2014 0834   EOSABS 0.1 12/15/2015 1021   BASOSABS 0.0 12/15/2015 1021   BASOSABS 0.0 08/27/2014 0834     Chemistry      Component Value Date/Time   NA 136 12/15/2015 1021   NA 138 08/27/2014 0838   K 4.2 12/15/2015 1021   K 4.1 08/27/2014 0838   CL 103 12/15/2015 1021   CO2 22 12/15/2015 1021   CO2 22 08/27/2014 0838   BUN 10 12/15/2015 1021   BUN 10.5 08/27/2014 0838   CREATININE 0.67 12/15/2015 1021   CREATININE 0.7 08/27/2014 0838      Component Value Date/Time   CALCIUM 8.6 (L) 12/15/2015 1021   CALCIUM 8.6 08/27/2014 0838   ALKPHOS 36 (L) 12/15/2015 1021   ALKPHOS 34 (L) 08/27/2014 0838   AST 16 12/15/2015 1021   AST 15 08/27/2014 0838   ALT 13 12/15/2015 1021   ALT 13 08/27/2014 0838   BILITOT <0.2 12/15/2015 1021   BILITOT 0.21 08/27/2014 4098       Radiological Studies: No results found.  Impression:  #1. 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 Chronic warfarin anticoagulation secondary to #1 and 2. We are monitoring PT/INR every 2-3 months. Transient, unexplained, fall into the subtherapeutic range. Minimal adjustment in her dose. Current INR therapeutic. We discussed the fact that there are still no mature clinical trial data comparing the new oral anticoagulants with warfarin in patients with antiphospholipid antibody syndrome. Anecdotal experience suggests that the NOACS are not sufficient to prevent recurrent clots in this subset of patients. Except for exceptional circumstances, Coumadin remains the anticoagulant of choice.   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  CC: Patient Care Team: Jomarie Longs, PA-C as PCP - General (Family  Medicine)   Levert Feinstein, MD 10/16/201710:27 AM

## 2016-04-26 NOTE — Progress Notes (Signed)
Anti-Coagulation Progress Note  Sarah Harrington is a 50 y.o. female who is currently on an anti-coagulation regimen.    RECENT RESULTS: Recent results are below, the most recent result is correlated with a dose of 50 mg. per week: Lab Results  Component Value Date   INR 3.10 04/26/2016   INR 1.6 04/16/2016   INR 2.80 12/15/2015   PROTIME 24.0 (H) 03/06/2015    ANTI-COAG DOSE: Anticoagulation Dose Instructions as of 04/26/2016      Sarah SmilesSun Mon Tue Wed Thu Fri Sat   New Dose 7.5 mg 5 mg 7.5 mg 5 mg 7.5 mg 5 mg 7.5 mg    Description   Resume usual regimen of 45mg /wk. Work to keep diet consistent relative to the amount of dark green leafy vegetables you elect to eat.       ANTICOAG SUMMARY: Anticoagulation Episode Summary    Current INR goal:   2.0-3.0  TTR:   73.1 % (4.8 y)  Next INR check:   07/19/2016  INR from last check:   3.10! (04/26/2016)  Weekly max dose:     Target end date:     INR check location:     Preferred lab:     Send INR reminders to:   [facility]   Indications   Antiphospholipid syndrome (HCC) [D68.61] DVT (deep venous thrombosis) (HCC) [I82.409]       Comments:         Anticoagulation Care Providers    Provider Role Specialty Phone number   Levert FeinsteinJames M Granfortuna, MD Referring Oncology [phone]      ANTICOAG TODAY: Anticoagulation Summary  As of 04/26/2016   INR goal:   2.0-3.0  TTR:     Today's INR:   3.10!  Next INR check:   07/19/2016  Target end date:      Indications   Antiphospholipid syndrome (HCC) [D68.61] DVT (deep venous thrombosis) (HCC) [I82.409]        Anticoagulation Episode Summary    INR check location:      Preferred lab:      Send INR reminders to:   [facility]   Comments:       Anticoagulation Care Providers    Provider Role Specialty Phone number   Levert FeinsteinJames M Granfortuna, MD Referring Oncology [phone]      PATIENT INSTRUCTIONS: There are no Patient Instructions on file for this visit.    FOLLOW-UP Return in about 3 months (around 07/19/2016) for Follow up INR at 0830h.  Hulen LusterJames Mikah Rottinghaus, III Pharm.D., CACP

## 2016-04-26 NOTE — Patient Instructions (Signed)
Patient instructed to take medications as defined in the Anti-coagulation Track section of this encounter.  Patient instructed to take today's dose.  Patient verbalized understanding of these instructions.    

## 2016-04-26 NOTE — Progress Notes (Signed)
Reviewed Thanks DrG 

## 2016-04-26 NOTE — Patient Instructions (Signed)
Point of care INR today with pharmacist Return visit with Dr Reece AgarG in 1 year - lab 1 week before visit

## 2016-09-15 ENCOUNTER — Telehealth: Payer: Self-pay | Admitting: *Deleted

## 2016-09-15 NOTE — Telephone Encounter (Signed)
Message from pt - requesting to have PT/INR done. Last done in Oct. Appt scheduled for Monday 3/12@ 1015AM.  Pt called/informed of appt.

## 2016-09-20 ENCOUNTER — Ambulatory Visit (INDEPENDENT_AMBULATORY_CARE_PROVIDER_SITE_OTHER): Payer: Commercial Managed Care - PPO | Admitting: Pharmacist

## 2016-09-20 DIAGNOSIS — D6861 Antiphospholipid syndrome: Secondary | ICD-10-CM | POA: Diagnosis not present

## 2016-09-20 DIAGNOSIS — I82401 Acute embolism and thrombosis of unspecified deep veins of right lower extremity: Secondary | ICD-10-CM | POA: Diagnosis not present

## 2016-09-20 DIAGNOSIS — Z7901 Long term (current) use of anticoagulants: Secondary | ICD-10-CM

## 2016-09-20 LAB — POCT INR: INR: 2.1

## 2016-09-20 NOTE — Patient Instructions (Signed)
Patient instructed to take medications as defined in the Anti-coagulation Track section of this encounter.  Patient instructed to take today's dose.  Patient instructed to take 1 tablet of your 5mg  peach-colored warfarin tablets by mouth, once-daily at 6PM. On ALL OTHER DAYS (Sundays, Tuesdays, Thursdays and Saturdays--take 1 & 1/2 tablets of your 5mg  peach-colored warfarin tablets by mouth, once-daily at [facility]6PM.  Patient verbalized understanding of these instructions.

## 2016-09-20 NOTE — Progress Notes (Signed)
Anti-Coagulation Progress Note  Sarah ReilSusan M Harrington is a 51 y.o. female who is currently on an anti-coagulation regimen.    RECENT RESULTS: Recent results are below, the most recent result is correlated with a dose of 45 mg. per week: Lab Results  Component Value Date   INR 2.10 09/20/2016   INR 3.10 04/26/2016   INR 1.6 04/16/2016   PROTIME 24.0 (H) 03/06/2015    ANTI-COAG DOSE: Anticoagulation Dose Instructions as of 09/20/2016      [name] Mon Tue Wed Thu Fri Sat   New Dose 7.5 mg 5 mg 7.5 mg 5 mg 7.5 mg 5 mg 7.5 mg    Description   Work to keep diet consistent relative to the amount of dark green leafy vegetables you elect to eat. Take 1 tablet of your 5mg  peach-colored warfarin tablets on Mondays, Wednesdays and Fridays; on all other days--take 1 & 1/2 tablets.       ANTICOAG SUMMARY: Anticoagulation Episode Summary    Current INR goal:   2.0-3.0  TTR:   74.4 % (5.2 y)  Next INR check:   12/13/2016  INR from last check:   2.10 (09/20/2016)  Weekly max dose:     Target end date:     INR check location:     Preferred lab:     Send INR reminders to:   [facility]   Indications   Antiphospholipid syndrome (HCC) [D68.61] DVT (deep venous thrombosis) (HCC) [I82.409]       Comments:         Anticoagulation Care Providers    Provider Role Specialty Phone number   Levert FeinsteinJames M Granfortuna, MD Referring Oncology [phone]      ANTICOAG TODAY: Anticoagulation Summary  As of 09/20/2016   INR goal:   2.0-3.0  TTR:     Today's INR:   2.10  Next INR check:   12/13/2016  Target end date:      Indications   Antiphospholipid syndrome (HCC) [D68.61] DVT (deep venous thrombosis) (HCC) [I82.409]        Anticoagulation Episode Summary    INR check location:      Preferred lab:      Send INR reminders to:   [facility]   Comments:       Anticoagulation Care Providers    Provider Role Specialty Phone number   Levert FeinsteinJames M Granfortuna, MD Referring Oncology  [phone]      PATIENT INSTRUCTIONS: Patient Instructions  Patient instructed to take medications as defined in the Anti-coagulation Track section of this encounter.  Patient instructed to take today's dose.  Patient instructed to take 1 tablet of your 5mg  peach-colored warfarin tablets by mouth, once-daily at 6PM. On ALL OTHER DAYS (Sundays, Tuesdays, Thursdays and Saturdays--take 1 & 1/2 tablets of your 5mg  peach-colored warfarin tablets by mouth, once-daily at [facility]6PM.  Patient verbalized understanding of these instructions.       FOLLOW-UP Return in 3 months (on 12/13/2016) for Follow up INR at 0830h.  [name], III Pharm.D., CACP

## 2016-09-22 NOTE — Progress Notes (Signed)
Reviewed Thanks DrG 

## 2016-10-15 ENCOUNTER — Other Ambulatory Visit: Payer: Self-pay | Admitting: Oncology

## 2016-12-13 ENCOUNTER — Ambulatory Visit (INDEPENDENT_AMBULATORY_CARE_PROVIDER_SITE_OTHER): Payer: Commercial Managed Care - PPO | Admitting: Pharmacist

## 2016-12-13 DIAGNOSIS — I82A19 Acute embolism and thrombosis of unspecified axillary vein: Secondary | ICD-10-CM

## 2016-12-13 DIAGNOSIS — Z7901 Long term (current) use of anticoagulants: Secondary | ICD-10-CM | POA: Diagnosis not present

## 2016-12-13 DIAGNOSIS — D6861 Antiphospholipid syndrome: Secondary | ICD-10-CM

## 2016-12-13 LAB — POCT INR: INR: 2.4

## 2016-12-13 NOTE — Progress Notes (Signed)
Reviewed Thanks DrG 

## 2016-12-13 NOTE — Progress Notes (Signed)
Anti-Coagulation Progress Note  Sarah Harrington is a 51 y.o. female who is currently on an anti-coagulation regimen.    RECENT RESULTS: Recent results are below, the most recent result is correlated with a dose of 45 mg. per week: Lab Results  Component Value Date   INR 2.40 12/13/2016   INR 2.10 09/20/2016   INR 3.10 04/26/2016   PROTIME 24.0 (H) 03/06/2015    ANTI-COAG DOSE: Anticoagulation Warfarin Dose Instructions as of 12/13/2016      [name] Mon Tue Wed Thu Fri Sat   New Dose 7.5 mg 5 mg 7.5 mg 5 mg 7.5 mg 5 mg 7.5 mg    Description   DR Patsy LagerGRANFORTUNA'S PATIENT (route warfarin notes and list as authorizing provider for LOS & Follow-up, lab orders and warfarin prescriptions), do not list attending physician. Work to keep diet consistent relative to the amount of dark green leafy vegetables. Take 1 tablet of your 5mg  peach-colored warfarin tablets on Mondays, Wednesdays and Fridays; on all other days--take 1 & 1/2 tablets.       ANTICOAG SUMMARY: Anticoagulation Episode Summary    Current INR goal:   2.0-3.0  TTR:   75.5 % (5.5 y)  Next INR check:   02/28/2017  INR from last check:   2.40 (12/13/2016)  Weekly max warfarin dose:     Target end date:     INR check location:     Preferred lab:     Send INR reminders to:   [facility]   Indications   Antiphospholipid syndrome (HCC) [D68.61] DVT (deep venous thrombosis) (HCC) [I82.409]       Comments:         Anticoagulation Care Providers    Provider Role Specialty Phone number   Levert FeinsteinGranfortuna, Alfhild Partch M, MD Referring Oncology [phone]      ANTICOAG TODAY: Anticoagulation Summary  As of 12/13/2016   INR goal:   2.0-3.0  TTR:     Today's INR:   2.40  Next INR check:   02/28/2017  Target end date:      Indications   Antiphospholipid syndrome (HCC) [D68.61] DVT (deep venous thrombosis) (HCC) [I82.409]        Anticoagulation Episode Summary    INR check location:      Preferred lab:      Send INR reminders  to:   [facility]   Comments:       Anticoagulation Care Providers    Provider Role Specialty Phone number   Levert FeinsteinGranfortuna, Savas Elvin M, MD Referring Oncology [phone]      PATIENT INSTRUCTIONS: Patient Instructions  Patient instructed to take medications as defined in the Anti-coagulation Track section of this encounter.  Patient instructed to take today's dose.  Patient instructed to take 1 tablet of your 5mg  peach-colored warfarin tablets on Mondays, Wednesdays and Fridays; on all other days--take 1 & 1/2 tablets.  Patient verbalized understanding of these instructions.       FOLLOW-UP Return in 3 months (on 02/28/2017) for Follow up INR at 0830h.  Hulen LusterJames Zelphia Glover, III Pharm.D., CACP

## 2016-12-13 NOTE — Patient Instructions (Signed)
Patient instructed to take medications as defined in the Anti-coagulation Track section of this encounter.  Patient instructed to take today's dose.  Patient instructed to take 1 tablet of your 5mg peach-colored warfarin tablets on Mondays, Wednesdays and Fridays; on all other days--take 1 & 1/2 tablets.  Patient verbalized understanding of these instructions.  

## 2017-02-28 ENCOUNTER — Ambulatory Visit (INDEPENDENT_AMBULATORY_CARE_PROVIDER_SITE_OTHER): Payer: Commercial Managed Care - PPO | Admitting: Pharmacist

## 2017-02-28 DIAGNOSIS — Z7901 Long term (current) use of anticoagulants: Secondary | ICD-10-CM

## 2017-02-28 DIAGNOSIS — D6861 Antiphospholipid syndrome: Secondary | ICD-10-CM

## 2017-02-28 DIAGNOSIS — I82402 Acute embolism and thrombosis of unspecified deep veins of left lower extremity: Secondary | ICD-10-CM

## 2017-02-28 LAB — POCT INR: INR: 2.8

## 2017-02-28 NOTE — Progress Notes (Signed)
Reviewed thx DrG 

## 2017-02-28 NOTE — Patient Instructions (Signed)
Patient instructed to take medications as defined in the Anti-coagulation Track section of this encounter.  Patient instructed to take today's dose.  Patient instructed to take 1 tablet of your 5mg  peach-colored warfarin tablets on Mondays, Wednesdays and Fridays; on all other days--take 1 & 1/2 tablets.  Patient verbalized understanding of these instructions.

## 2017-02-28 NOTE — Progress Notes (Signed)
Anticoagulation Management Sarah Harrington is a 51 y.o. female who reports to the clinic for monitoring of warfarin treatment.    Indication: DVT, h/o antiphospholipid syndrome  Duration: indefinite Supervising physician: Cephas Darby  Anticoagulation Clinic Visit History: Patient does not report signs/symptoms of bleeding or thromboembolism  Other recent changes: No diet, medications, lifestyle changes reported by the patient to me. Anticoagulation Episode Summary    Current INR goal:   2.0-3.0  TTR:   76.4 % (5.7 y)  Next INR check:   05/23/2017  INR from last check:   2.8 (02/28/2017)  Weekly max warfarin dose:     Target end date:     INR check location:     Preferred lab:     Send INR reminders to:   [facility]   Indications   Antiphospholipid syndrome (HCC) [D68.61] DVT (deep venous thrombosis) (HCC) [I82.409]       Comments:         Anticoagulation Care Providers    Provider Role Specialty Phone number   Levert Feinstein, MD Referring Oncology [phone]      No Known Allergies Prior to Admission medications   Medication Sig Start Date End Date Taking? Authorizing Provider  aspirin 81 MG tablet Take 81 mg by mouth daily.     Yes [provider]  COUMADIN 5 MG tablet take 1 and 1/2 tablets by mouth once daily , EXCEPT TAKE ONL 10/15/16  Yes Levert Feinstein, MD   Past Medical History:  Diagnosis Date  . History of pulmonary embolus (PE) 02/13/2012   Social History   Social History  . Marital status: Married    Spouse name: N/A  . Number of children: N/A  . Years of education: N/A   Social History Main Topics  . Smoking status: Never Smoker  . Smokeless tobacco: Never Used  . Alcohol use 0.0 oz/week  . Drug use: No  . Sexual activity: Not on file   Other Topics Concern  . Not on file   Social History Narrative  . No narrative on file   Family History  Problem Relation Age of Onset  . Hyperlipidemia Father   .  Cancer Maternal Grandfather        prostate  . Cancer Paternal Grandmother        breast  . Heart attack Paternal Grandfather     ASSESSMENT Recent Results: The most recent result is correlated with 45 mg per week: Lab Results  Component Value Date   INR 2.8 02/28/2017   INR 2.40 12/13/2016   INR 2.10 09/20/2016   PROTIME 24.0 (H) 03/06/2015    Anticoagulation Dosing: INR as of 02/28/2017 and Previous Warfarin Dosing Information    INR Dt INR Goal Wkly Tot [name]   02/28/2017 2.8 2.0-3.0 45 mg 7.5 mg 5 mg 7.5 mg 5 mg 7.5 mg 5 mg 7.5 mg    Previous description   DR Patsy Lager PATIENT (route warfarin notes and list as authorizing provider for LOS & Follow-up, lab orders and warfarin prescriptions), do not list attending physician. Work to keep diet consistent relative to the amount of dark green leafy vegetables. Take 1 tablet of your 5mg  peach-colored warfarin tablets on Mondays, Wednesdays and Fridays; on all other days--take 1 & 1/2 tablets.    Anticoagulation Warfarin Dose Instructions as of 02/28/2017      Total [name]   New Dose 45 mg  7.5 mg 5 mg 7.5 mg 5 mg 7.5 mg 5 mg 7.5 mg     (5 mg x 1.5)  (5 mg x 1)  (5 mg x 1.5)  (5 mg x 1)  (5 mg x 1.5)  (5 mg x 1)  (5 mg x 1.5)                         Description   DR WUJWJXBJYNW'G PATIENT (route warfarin notes and list as authorizing provider for LOS & Follow-up, lab orders and warfarin prescriptions), do not list attending physician. Work to keep diet consistent relative to the amount of dark green leafy vegetables. Take 1 tablet of your 5mg  peach-colored warfarin tablets on Mondays, Wednesdays and Fridays; on all other days--take 1 & 1/2 tablets.      INR today: Therapeutic  PLAN Weekly dose was unchanged.  Patient Instructions  Patient instructed to take medications as defined in the Anti-coagulation Track section of this encounter.  Patient instructed to take today's dose.   Patient instructed to take 1 tablet of your 5mg  peach-colored warfarin tablets on Mondays, Wednesdays and Fridays; on all other days--take 1 & 1/2 tablets.  Patient verbalized understanding of these instructions.   Patient advised to contact clinic or seek medical attention if signs/symptoms of bleeding or thromboembolism occur.  Patient verbalized understanding by repeating back information and was advised to contact me if further medication-related questions arise. Patient was also provided an information handout.  Follow-up Return in 3 months (on 05/23/2017) for Follow up INR at 0830.  Roderic Scarce Zigmund Daniel, PharmD PGY1 Pharmacy Resident  15 minutes spent face-to-face with the patient during the encounter. 50% of time spent on education. 50% of time was spent on point of care fingerstick INR sample collection, processing, results, interpretation, and documentation in Epic/[facility] and [email or web address].

## 2017-05-23 ENCOUNTER — Ambulatory Visit (INDEPENDENT_AMBULATORY_CARE_PROVIDER_SITE_OTHER): Payer: Commercial Managed Care - PPO

## 2017-05-23 DIAGNOSIS — Z86711 Personal history of pulmonary embolism: Secondary | ICD-10-CM

## 2017-05-23 DIAGNOSIS — D6861 Antiphospholipid syndrome: Secondary | ICD-10-CM

## 2017-05-23 DIAGNOSIS — Z7982 Long term (current) use of aspirin: Secondary | ICD-10-CM

## 2017-05-23 DIAGNOSIS — Z7901 Long term (current) use of anticoagulants: Secondary | ICD-10-CM | POA: Diagnosis not present

## 2017-05-23 DIAGNOSIS — I82402 Acute embolism and thrombosis of unspecified deep veins of left lower extremity: Secondary | ICD-10-CM | POA: Diagnosis not present

## 2017-05-23 LAB — POCT INR: INR: 2.8

## 2017-05-23 NOTE — Progress Notes (Signed)
Reviewed thx DrG 

## 2017-05-23 NOTE — Patient Instructions (Signed)
Patient instructed to take medications as defined in the Anti-coagulation Track section of this encounter.  Patient instructed to take today's dose.  Patient instructed to work to keep diet consistent relative to the amount of dark green leafy vegetables. Take 1 tablet of your 5mg  peach-colored warfarin tablets on Mondays, Wednesdays and Fridays; on all other days--take 1 & 1/2 tablets. Patient verbalized understanding of these instructions.

## 2017-05-23 NOTE — Progress Notes (Signed)
Anticoagulation Management Sarah ReilSusan M Harrington is a 51 y.o. female who reports to the clinic for monitoring of warfarin treatment.    Indication: DVT and antiphospholipid syndrome  Duration: indefinite Supervising physician: Cephas DarbyJames Granfortuna  Anticoagulation Clinic Visit History: Patient does not report signs/symptoms of bleeding or thromboembolism. Other recent changes: She denies any diet, medications, or lifestyle changes. Anticoagulation Episode Summary    Current INR goal:   2.0-3.0  TTR:   77.3 % (5.9 y)  Next INR check:   08/15/2017  INR from last check:   2.8 (05/23/2017)  Weekly max warfarin dose:     Target end date:     INR check location:     Preferred lab:     Send INR reminders to:   [facility]   Indications   Antiphospholipid syndrome (HCC) [D68.61] DVT (deep venous thrombosis) (HCC) [I82.409]       Comments:         Anticoagulation Care Providers    Provider Role Specialty Phone number   Levert FeinsteinGranfortuna, James M, MD Referring Oncology [phone]      No Known Allergies Prior to Admission medications   Medication Sig Start Date End Date Taking? Authorizing Provider  aspirin 81 MG tablet Take 81 mg by mouth daily.      [provider]  COUMADIN 5 MG tablet take 1 and 1/2 tablets by mouth once daily , EXCEPT TAKE ONL 10/15/16   Levert FeinsteinGranfortuna, James M, MD   Past Medical History:  Diagnosis Date  . History of pulmonary embolus (PE) 02/13/2012   Social History   Socioeconomic History  . Marital status: Married    Spouse name: Not on file  . Number of children: Not on file  . Years of education: Not on file  . Highest education level: Not on file  Social Needs  . Financial resource strain: Not on file  . Food insecurity - worry: Not on file  . Food insecurity - inability: Not on file  . Transportation needs - medical: Not on file  . Transportation needs - non-medical: Not on file  Occupational History  . Not on file  Tobacco Use  .  Smoking status: Never Smoker  . Smokeless tobacco: Never Used  Substance and Sexual Activity  . Alcohol use: Yes    Alcohol/week: 0.0 oz  . Drug use: No  . Sexual activity: Not on file  Other Topics Concern  . Not on file  Social History Narrative  . Not on file   Family History  Problem Relation Age of Onset  . Hyperlipidemia Father   . Cancer Maternal Grandfather        prostate  . Cancer Paternal Grandmother        breast  . Heart attack Paternal Grandfather     ASSESSMENT Recent Results: The most recent result is correlated with 45 mg per week: Lab Results  Component Value Date   INR 2.8 05/23/2017   INR 2.8 02/28/2017   INR 2.40 12/13/2016   PROTIME 24.0 (H) 03/06/2015    Anticoagulation Dosing: Description   DR UJWJXBJYNWG'NGRANFORTUNA'S PATIENT (route warfarin notes and list as authorizing provider for LOS & Follow-up, lab orders and warfarin prescriptions), do not list attending physician. Work to keep diet consistent relative to the amount of dark green leafy vegetables. Take 1 tablet of your 5mg  peach-colored warfarin tablets on Mondays, Wednesdays and Fridays; on all other days--take 1 & 1/2 tablets.      INR today: Therapeutic  PLAN  Continue weekly dose of 45mg .  Patient Instructions  Patient instructed to take medications as defined in the Anti-coagulation Track section of this encounter.  Patient instructed to take today's dose.  Patient instructed to work to keep diet consistent relative to the amount of dark green leafy vegetables. Take 1 tablet of your 5mg  peach-colored warfarin tablets on Mondays, Wednesdays and Fridays; on all other days--take 1 & 1/2 tablets. Patient verbalized understanding of these instructions.    Patient advised to contact clinic or seek medical attention if signs/symptoms of bleeding or thromboembolism occur. As per CHEST guidelines and per request of the patient, she prefers to have 12 week follow up for her INR.  Patient verbalized  understanding by repeating back information and was advised to contact me if further medication-related questions arise. Patient was also provided an information handout.  Follow-up Return in about 3 months (around 08/15/2017) for INR follow up at 0830.  Nolen MuAustin J Lucas PharmD PGY1 Pharmacy Practice Resident 05/23/2017 10:03 AM  30 minutes spent face-to-face with the patient during the encounter. 50% of time spent on education. 50% of time was spent on collection of INR sample, interpretation of results, discussion of plan with patient, and documentation into [email or web address] and EPIC.

## 2017-07-10 ENCOUNTER — Other Ambulatory Visit: Payer: Self-pay | Admitting: Oncology

## 2017-08-15 ENCOUNTER — Ambulatory Visit (INDEPENDENT_AMBULATORY_CARE_PROVIDER_SITE_OTHER): Payer: Commercial Managed Care - PPO | Admitting: Pharmacist

## 2017-08-15 DIAGNOSIS — I82402 Acute embolism and thrombosis of unspecified deep veins of left lower extremity: Secondary | ICD-10-CM

## 2017-08-15 DIAGNOSIS — Z7901 Long term (current) use of anticoagulants: Secondary | ICD-10-CM | POA: Diagnosis not present

## 2017-08-15 DIAGNOSIS — D6861 Antiphospholipid syndrome: Secondary | ICD-10-CM | POA: Diagnosis not present

## 2017-08-15 LAB — POCT INR: INR: 2.3

## 2017-08-15 NOTE — Progress Notes (Signed)
Reviewed Thanks drG 

## 2017-08-15 NOTE — Progress Notes (Signed)
Anticoagulation Management Sarah ReilSusan M Bachus is a 52 y.o. female who reports to the clinic for monitoring of warfarin treatment.    Indication: antiphospholipid syndrome  Duration: indefinite Supervising physician: Cephas DarbyJames Granfortuna  Anticoagulation Clinic Visit History: Patient does not report signs/symptoms of bleeding or thromboembolism  Other recent changes: No changes in diet, medications, lifestyle Anticoagulation Episode Summary    Current INR goal:   2.0-3.0  TTR:   78.2 % (6.1 y)  Next INR check:   08/15/2017  INR from last check:   2.3 (08/15/2017)  Weekly max warfarin dose:     Target end date:     INR check location:     Preferred lab:     Send INR reminders to:   [facility]   Indications   Antiphospholipid syndrome (HCC) [D68.61] DVT (deep venous thrombosis) (HCC) [I82.409]       Comments:         Anticoagulation Care Providers    Provider Role Specialty Phone number   Levert FeinsteinGranfortuna, James M, MD Referring Oncology [phone]      No Known Allergies Prior to Admission medications   Medication Sig Start Date End Date Taking? Authorizing Provider  aspirin 81 MG tablet Take 81 mg by mouth daily.      [provider]  COUMADIN 5 MG tablet TAKE 1 AND 1/2 TABLETS BY MOUTH ONCE DAILY, EXCEPT ONLY 1 TABLET ON MONDAY, WEDNESDAY, AND FRIDAY 07/13/17   Levert FeinsteinGranfortuna, James M, MD   Past Medical History:  Diagnosis Date  . History of pulmonary embolus (PE) 02/13/2012   Social History   Socioeconomic History  . Marital status: Married    Spouse name: Not on file  . Number of children: Not on file  . Years of education: Not on file  . Highest education level: Not on file  Social Needs  . Financial resource strain: Not on file  . Food insecurity - worry: Not on file  . Food insecurity - inability: Not on file  . Transportation needs - medical: Not on file  . Transportation needs - non-medical: Not on file  Occupational History  . Not on file  Tobacco Use  .  Smoking status: Never Smoker  . Smokeless tobacco: Never Used  Substance and Sexual Activity  . Alcohol use: Yes    Alcohol/week: 0.0 oz  . Drug use: No  . Sexual activity: Not on file  Other Topics Concern  . Not on file  Social History Narrative  . Not on file   Family History  Problem Relation Age of Onset  . Hyperlipidemia Father   . Cancer Maternal Grandfather        prostate  . Cancer Paternal Grandmother        breast  . Heart attack Paternal Grandfather     ASSESSMENT Recent Results: The most recent result is correlated with 45 mg per week: Lab Results  Component Value Date   INR 2.3 08/15/2017   INR 2.8 05/23/2017   INR 2.8 02/28/2017   PROTIME 24.0 (H) 03/06/2015    Anticoagulation Dosing: Description   DR UJWJXBJYNWG'NGRANFORTUNA'S PATIENT (route warfarin notes and list as authorizing provider for LOS & Follow-up, lab orders and warfarin prescriptions), do not list attending physician. Work to keep diet consistent relative to the amount of dark green leafy vegetables. Take 1 tablet of your 5mg  peach-colored warfarin tablets on Mondays, Wednesdays and Fridays; on all other days--take 1 & 1/2 tablets.      INR today: Therapeutic  PLAN Weekly dose was unchanged  Patient Instructions  Patient instructed to take medications as defined in the Anti-coagulation Track section of this encounter.  Patient instructed to take today's dose.  Patient instructed to take 1 tablet of your 5mg  peach-colored warfarin tablets on Mondays, Wednesdays and Fridays; on all other days--take 1 & 1/2 tablets.  Patient verbalized understanding of these instructions.     Patient advised to contact clinic or seek medical attention if signs/symptoms of bleeding or thromboembolism occur.  Patient verbalized understanding by repeating back information and was advised to contact me if further medication-related questions arise. Patient was also provided an information handout.  Follow-up Return in 3  months (on 11/07/2017) for Follow up INR at 0830.  Roderic Scarce Zigmund Daniel, PharmD PGY1 Pharmacy Resident Pager: [phone]  15 minutes spent face-to-face with the patient during the encounter. 50% of time spent on education. 50% of time was spent on point of care INR testing, results interpretation, and documentation in [facility] and [email or web address].

## 2017-08-15 NOTE — Patient Instructions (Signed)
Patient instructed to take medications as defined in the Anti-coagulation Track section of this encounter.  Patient instructed to take today's dose.  Patient instructed to take 1 tablet of your 5mg peach-colored warfarin tablets on Mondays, Wednesdays and Fridays; on all other days--take 1 & 1/2 tablets.  Patient verbalized understanding of these instructions.  

## 2017-10-12 ENCOUNTER — Other Ambulatory Visit: Payer: Self-pay | Admitting: Oncology

## 2017-10-13 ENCOUNTER — Other Ambulatory Visit: Payer: Self-pay | Admitting: Oncology

## 2017-10-13 NOTE — Telephone Encounter (Signed)
Patient is calling requesting refill on coumadin medicine

## 2017-10-13 NOTE — Telephone Encounter (Signed)
Called pt and made her aware of completion

## 2017-11-07 ENCOUNTER — Ambulatory Visit: Payer: Commercial Managed Care - PPO

## 2018-01-02 ENCOUNTER — Ambulatory Visit (INDEPENDENT_AMBULATORY_CARE_PROVIDER_SITE_OTHER): Payer: Commercial Managed Care - PPO

## 2018-01-02 DIAGNOSIS — I82402 Acute embolism and thrombosis of unspecified deep veins of left lower extremity: Secondary | ICD-10-CM

## 2018-01-02 DIAGNOSIS — D6861 Antiphospholipid syndrome: Secondary | ICD-10-CM

## 2018-01-02 LAB — POCT INR: INR: 2.4 (ref 2.0–3.0)

## 2018-01-02 MED ORDER — COUMADIN 5 MG PO TABS: ORAL_TABLET | ORAL | 5 refills | Status: DC

## 2018-01-02 NOTE — Progress Notes (Signed)
Anticoagulation Management Sarah Harrington is a 52 y.o. female who reports to the clinic for monitoring of warfarin treatment.    Indication: DVT and antiphospholipid syndrome  Duration: indefinite Supervising physician: Cephas Darby  Anticoagulation Clinic Visit History: Patient does not report signs/symptoms of bleeding or thromboembolism  Other recent changes: Denies any diet, medications, lifestyle changes Anticoagulation Episode Summary    Current INR goal:   2.0-3.0  TTR:   79.4 % (6.5 y)  Next INR check:   03/27/2018  INR from last check:   2.4 (01/02/2018)  Weekly max warfarin dose:     Target end date:     INR check location:     Preferred lab:     Send INR reminders to:   [facility]   Indications   Antiphospholipid syndrome (HCC) [D68.61] DVT (deep venous thrombosis) (HCC) [I82.409]       Comments:         Anticoagulation Care Providers    Provider Role Specialty Phone number   Levert Feinstein, MD Referring Oncology [phone]      No Known Allergies Prior to Admission medications   Medication Sig Start Date End Date Taking? Authorizing Provider  aspirin 81 MG tablet Take 81 mg by mouth daily.      [provider]  COUMADIN 5 MG tablet TAKE 1 AND 1/2 TABLETS BY MOUTH ONCE DAILY, EXCEPT ONLY 1 TABLET ON MONDAY, WEDNESDAY, AND FRIDAY 10/12/17   Levert Feinstein, MD   Past Medical History:  Diagnosis Date  . History of pulmonary embolus (PE) 02/13/2012   Social History   Socioeconomic History  . Marital status: Married    Spouse name: Not on file  . Number of children: Not on file  . Years of education: Not on file  . Highest education level: Not on file  Occupational History  . Not on file  Social Needs  . Financial resource strain: Not on file  . Food insecurity:    Worry: Not on file    Inability: Not on file  . Transportation needs:    Medical: Not on file    Non-medical: Not on file  Tobacco Use  . Smoking status:  Never Smoker  . Smokeless tobacco: Never Used  Substance and Sexual Activity  . Alcohol use: Yes    Alcohol/week: 0.0 oz  . Drug use: No  . Sexual activity: Not on file  Lifestyle  . Physical activity:    Days per week: Not on file    Minutes per session: Not on file  . Stress: Not on file  Relationships  . Social connections:    Talks on phone: Not on file    Gets together: Not on file    Attends religious service: Not on file    Active member of club or organization: Not on file    Attends meetings of clubs or organizations: Not on file    Relationship status: Not on file  Other Topics Concern  . Not on file  Social History Narrative  . Not on file   Family History  Problem Relation Age of Onset  . Hyperlipidemia Father   . Cancer Maternal Grandfather        prostate  . Cancer Paternal Grandmother        breast  . Heart attack Paternal Grandfather     ASSESSMENT Recent Results: The most recent result is correlated with 45 mg per week: Lab Results  Component Value Date   INR  2.4 01/02/2018   INR 2.3 08/15/2017   INR 2.8 05/23/2017   PROTIME 24.0 (H) 03/06/2015    Anticoagulation Dosing: Description   DR ZOXWRUEAVWU'JGRANFORTUNA'S PATIENT (route warfarin notes and list as authorizing provider for LOS & Follow-up, lab orders and warfarin prescriptions), do not list attending physician. Work to keep diet consistent relative to the amount of dark green leafy vegetables. Take 1 tablet of your 5mg  peach-colored warfarin tablets on Mondays, Wednesdays and Fridays; on all other days--take 1 & 1/2 tablets.      INR today: Therapeutic  PLAN Weekly dose was unchanged   Patient Instructions  Patient instructed to take medications as defined in the Anti-coagulation Track section of this encounter.  Patient instructed to take today's dose.  Patient verbalized understanding of these instructions.  Patient was instructed to work to keep diet consistent relative to the amount of dark  green leafy vegetables. Take 1 tablet of your 5mg  peach-colored warfarin tablets on Mondays, Wednesdays and Fridays; on all other days--take 1 & 1/2 tablets.   Patient advised to contact clinic or seek medical attention if signs/symptoms of bleeding or thromboembolism occur.  Patient verbalized understanding by repeating back information and was advised to contact me if further medication-related questions arise. Patient was also provided an information handout.  Follow-up Return in about 3 months (around 03/27/2018) for INR f/u @0815 .  Nolen MuAustin J Lucas PharmD PGY1 Acute Care Pharmacy Resident 01/02/2018 10:55 AM  15 minutes spent face-to-face with the patient during the encounter. 50% of time spent on education. 50% of time was spent on collection of INR and documentation into [email or web address] and the electronic medical record.

## 2018-01-02 NOTE — Progress Notes (Signed)
reviewed thx DrG 

## 2018-01-02 NOTE — Patient Instructions (Signed)
Patient instructed to take medications as defined in the Anti-coagulation Track section of this encounter.  Patient instructed to take today's dose.  Patient verbalized understanding of these instructions.  Patient was instructed to work to keep diet consistent relative to the amount of dark green leafy vegetables. Take 1 tablet of your 5mg  peach-colored warfarin tablets on Mondays, Wednesdays and Fridays; on all other days--take 1 & 1/2 tablets.

## 2018-03-27 ENCOUNTER — Ambulatory Visit: Payer: Commercial Managed Care - PPO

## 2018-04-03 ENCOUNTER — Ambulatory Visit (INDEPENDENT_AMBULATORY_CARE_PROVIDER_SITE_OTHER): Payer: Commercial Managed Care - PPO | Admitting: Pharmacist

## 2018-04-03 DIAGNOSIS — Z7901 Long term (current) use of anticoagulants: Secondary | ICD-10-CM | POA: Diagnosis not present

## 2018-04-03 DIAGNOSIS — Z5181 Encounter for therapeutic drug level monitoring: Secondary | ICD-10-CM

## 2018-04-03 DIAGNOSIS — I82402 Acute embolism and thrombosis of unspecified deep veins of left lower extremity: Secondary | ICD-10-CM | POA: Diagnosis not present

## 2018-04-03 DIAGNOSIS — D6861 Antiphospholipid syndrome: Secondary | ICD-10-CM | POA: Diagnosis not present

## 2018-04-03 LAB — POCT INR: INR: 2.1 (ref 2.0–3.0)

## 2018-04-03 NOTE — Progress Notes (Signed)
reviewed thx DrG

## 2018-04-03 NOTE — Patient Instructions (Signed)
Patient instructed to take medications as defined in the Anti-coagulation Track section of this encounter.  Patient instructed to take today's dose.  Patient instructed to take one and one-half (1 & 1/2) tablets of your 5mg  peach-colored warfarin tablets every day--EXCEPT on Fridays, take only one (1) tablet of your 5mg  peach-colored warfarin tablet on Fridays.  Patient verbalized understanding of these instructions.

## 2018-04-03 NOTE — Progress Notes (Signed)
Anticoagulation Management Sarah Harrington is a 52 y.o. female who reports to the clinic for monitoring of warfarin treatment.    Indication: Antiphospholipid syndrome, DVT, history of, long term current use of anticoagulants.    Duration: indefinite Supervising physician: Cephas Darby  Anticoagulation Clinic Visit History: Patient does not report signs/symptoms of bleeding or thromboembolism  Other recent changes: No diet, medications, lifestyle changes endorsed at this visit.  Anticoagulation Episode Summary    Current INR goal:   2.0-3.0  TTR:   80.2 % (6.8 y)  Next INR check:   06/26/2018  INR from last check:   2.1 (04/03/2018)  Weekly max warfarin dose:     Target end date:     INR check location:     Preferred lab:     Send INR reminders to:   [facility]   Indications   Antiphospholipid syndrome (HCC) [D68.61] DVT (deep venous thrombosis) (HCC) [I82.409]       Comments:         Anticoagulation Care Providers    Provider Role Specialty Phone number   Levert Feinstein, MD Referring Oncology [phone]      No Known Allergies Prior to Admission medications   Medication Sig Start Date End Date Taking? Authorizing Provider  aspirin 81 MG tablet Take 81 mg by mouth daily.     Yes [provider]  COUMADIN 5 MG tablet TAKE 1 AND 1/2 TABLETS BY MOUTH ONCE DAILY, EXCEPT ONLY 1 TABLET ON MONDAY, WEDNESDAY, AND FRIDAY 01/02/18  Yes Levert Feinstein, MD   Past Medical History:  Diagnosis Date  . History of pulmonary embolus (PE) 02/13/2012   Social History   Socioeconomic History  . Marital status: Married    Spouse name: Not on file  . Number of children: Not on file  . Years of education: Not on file  . Highest education level: Not on file  Occupational History  . Not on file  Social Needs  . Financial resource strain: Not on file  . Food insecurity:    Worry: Not on file    Inability: Not on file  . Transportation needs:    Medical:  Not on file    Non-medical: Not on file  Tobacco Use  . Smoking status: Never Smoker  . Smokeless tobacco: Never Used  Substance and Sexual Activity  . Alcohol use: Yes    Alcohol/week: 0.0 standard drinks  . Drug use: No  . Sexual activity: Not on file  Lifestyle  . Physical activity:    Days per week: Not on file    Minutes per session: Not on file  . Stress: Not on file  Relationships  . Social connections:    Talks on phone: Not on file    Gets together: Not on file    Attends religious service: Not on file    Active member of club or organization: Not on file    Attends meetings of clubs or organizations: Not on file    Relationship status: Not on file  Other Topics Concern  . Not on file  Social History Narrative  . Not on file   Family History  Problem Relation Age of Onset  . Hyperlipidemia Father   . Cancer Maternal Grandfather        prostate  . Cancer Paternal Grandmother        breast  . Heart attack Paternal Grandfather     ASSESSMENT Recent Results: The most recent result is correlated  with 45 mg per week: Lab Results  Component Value Date   INR 2.1 04/03/2018   INR 2.4 01/02/2018   INR 2.3 08/15/2017   PROTIME 24.0 (H) 03/06/2015    Anticoagulation Dosing: Description   DR [id number]'GGRANFORTUNA'S PATIENT (route warfarin notes and list as authorizing provider for LOS & Follow-up, lab orders and warfarin prescriptions), do not list attending physician. Work to keep diet consistent relative to the amount of dark green leafy vegetables. Take one and one-half (1 & 1/2) tablets of your 5mg  peach-colored warfarin tablets every day--EXCEPT on Fridays, take only one (1) tablet of your 5mg  peach-colored warfarin tablet on Fridays.      INR today: Therapeutic  PLAN Weekly dose was increased by 11% to 50 mg per week  Patient Instructions  Patient instructed to take medications as defined in the Anti-coagulation Track section of this encounter.  Patient instructed  to take today's dose.  Patient instructed to take one and one-half (1 & 1/2) tablets of your 5mg  peach-colored warfarin tablets every day--EXCEPT on Fridays, take only one (1) tablet of your 5mg  peach-colored warfarin tablet on Fridays.  Patient verbalized understanding of these instructions.     Patient advised to contact clinic or seek medical attention if signs/symptoms of bleeding or thromboembolism occur.  Patient verbalized understanding by repeating back information and was advised to contact me if further medication-related questions arise. Patient was also provided an information handout.  Follow-up Return in 12 weeks (on 06/26/2018) for Follow up INR at 0830h.  Sarah Harrington, PharmD, CPP  15 minutes spent face-to-face with the patient during the encounter. 50% of time spent on education. 50% of time was spent on fingerstick point of care INR sample collection, processing, results determination, dose adjustment and documentation in [email or web address].

## 2018-04-05 ENCOUNTER — Encounter: Payer: Commercial Managed Care - PPO | Admitting: Physician Assistant

## 2018-04-12 ENCOUNTER — Encounter: Payer: Self-pay | Admitting: Physician Assistant

## 2018-04-12 ENCOUNTER — Ambulatory Visit (INDEPENDENT_AMBULATORY_CARE_PROVIDER_SITE_OTHER): Payer: Commercial Managed Care - PPO | Admitting: Physician Assistant

## 2018-04-12 VITALS — BP 131/80 | HR 61 | Ht 66.0 in | Wt 201.0 lb

## 2018-04-12 DIAGNOSIS — Z23 Encounter for immunization: Secondary | ICD-10-CM | POA: Diagnosis not present

## 2018-04-12 DIAGNOSIS — Z1231 Encounter for screening mammogram for malignant neoplasm of breast: Secondary | ICD-10-CM

## 2018-04-12 DIAGNOSIS — Z13220 Encounter for screening for lipoid disorders: Secondary | ICD-10-CM

## 2018-04-12 DIAGNOSIS — Z0000 Encounter for general adult medical examination without abnormal findings: Secondary | ICD-10-CM

## 2018-04-12 DIAGNOSIS — Z131 Encounter for screening for diabetes mellitus: Secondary | ICD-10-CM | POA: Diagnosis not present

## 2018-04-12 DIAGNOSIS — E6609 Other obesity due to excess calories: Secondary | ICD-10-CM

## 2018-04-12 DIAGNOSIS — Z6832 Body mass index (BMI) 32.0-32.9, adult: Secondary | ICD-10-CM

## 2018-04-12 LAB — COMPLETE METABOLIC PANEL WITH GFR
AG Ratio: 1.7 (calc) (ref 1.0–2.5)
ALBUMIN MSPROF: 4.3 g/dL (ref 3.6–5.1)
ALKALINE PHOSPHATASE (APISO): 35 U/L (ref 33–130)
ALT: 14 U/L (ref 6–29)
AST: 18 U/L (ref 10–35)
BILIRUBIN TOTAL: 0.5 mg/dL (ref 0.2–1.2)
BUN: 10 mg/dL (ref 7–25)
CHLORIDE: 104 mmol/L (ref 98–110)
CO2: 23 mmol/L (ref 20–32)
Calcium: 9 mg/dL (ref 8.6–10.4)
Creat: 0.74 mg/dL (ref 0.50–1.05)
GFR, Est African American: 109 mL/min/{1.73_m2} (ref >=60)
GFR, Est Non African American: 94 mL/min/{1.73_m2} (ref >=60)
GLUCOSE: 97 mg/dL (ref 65–99)
Globulin: 2.6 g/dL (calc) (ref 1.9–3.7)
Potassium: 4.7 mmol/L (ref 3.5–5.3)
SODIUM: 136 mmol/L (ref 135–146)
Total Protein: 6.9 g/dL (ref 6.1–8.1)

## 2018-04-12 LAB — LIPID PANEL W/REFLEX DIRECT LDL
Cholesterol: 262 mg/dL — ABNORMAL HIGH (ref <200)
HDL: 75 mg/dL (ref >50)
LDL Cholesterol (Calc): 164 mg/dL (calc) — ABNORMAL HIGH
Non-HDL Cholesterol (Calc): 187 mg/dL (calc) — ABNORMAL HIGH (ref <130)
TRIGLYCERIDES: 109 mg/dL (ref <150)
Total CHOL/HDL Ratio: 3.5 (calc) (ref <5.0)

## 2018-04-12 LAB — TSH: TSH: 1.85 m[IU]/L

## 2018-04-12 NOTE — Progress Notes (Signed)
l Subjective:     Sarah Harrington is a 52 y.o. female and is here for a comprehensive physical exam. The patient reports no problems.  Social History   Socioeconomic History  . Marital status: Married    Spouse name: Not on file  . Number of children: Not on file  . Years of education: Not on file  . Highest education level: Not on file  Occupational History  . Not on file  Social Needs  . Financial resource strain: Not on file  . Food insecurity:    Worry: Not on file    Inability: Not on file  . Transportation needs:    Medical: Not on file    Non-medical: Not on file  Tobacco Use  . Smoking status: Never Smoker  . Smokeless tobacco: Never Used  Substance and Sexual Activity  . Alcohol use: Yes    Alcohol/week: 0.0 standard drinks  . Drug use: No  . Sexual activity: Not on file  Lifestyle  . Physical activity:    Days per week: Not on file    Minutes per session: Not on file  . Stress: Not on file  Relationships  . Social connections:    Talks on phone: Not on file    Gets together: Not on file    Attends religious service: Not on file    Active member of club or organization: Not on file    Attends meetings of clubs or organizations: Not on file    Relationship status: Not on file  . Intimate partner violence:    Fear of current or ex partner: Not on file    Emotionally abused: Not on file    Physically abused: Not on file    Forced sexual activity: Not on file  Other Topics Concern  . Not on file  Social History Narrative  . Not on file   Health Maintenance  Topic Date Due  . PAP SMEAR  11/18/1987  . MAMMOGRAM  08/29/2015  . COLONOSCOPY  11/17/2016  . HIV Screening  04/13/2019 (Originally 11/17/1981)  . TETANUS/TDAP  04/12/2028  . INFLUENZA VACCINE  Completed    The following portions of the patient's history were reviewed and updated as appropriate: allergies, current medications, past family history, past medical history, past social history,  past surgical history and problem list.  Review of Systems A comprehensive review of systems was negative.   Objective:    BP 131/80   Pulse 61   Ht 5\' 6"  (1.676 m)   Wt 201 lb (91.2 kg)   BMI 32.44 kg/m  General appearance: alert, cooperative and appears stated age Head: Normocephalic, without obvious abnormality, atraumatic Eyes: conjunctivae/corneas clear. PERRL, EOM's intact. Fundi benign. Ears: normal TM's and external ear canals both ears Nose: Nares normal. Septum midline. Mucosa normal. No drainage or sinus tenderness. Throat: lips, mucosa, and tongue normal; teeth and gums normal Neck: no adenopathy, no carotid bruit, no JVD, supple, symmetrical, trachea midline and thyroid not enlarged, symmetric, no tenderness/mass/nodules Back: symmetric, no curvature. ROM normal. No CVA tenderness. Lungs: clear to auscultation bilaterally Heart: regular rate and rhythm, S1, S2 normal, no murmur, click, rub or gallop Abdomen: soft, non-tender; bowel sounds normal; no masses,  no organomegaly Extremities: extremities normal, atraumatic, no cyanosis or edema Pulses: 2+ and symmetric Skin: Skin color, texture, turgor normal. No rashes or lesions Lymph nodes: Cervical, supraclavicular, and axillary nodes normal. Neurologic: Alert and oriented X 3, normal strength and tone. Normal symmetric reflexes. Normal  coordination and gait    Assessment:    Healthy female exam.      Plan:    Marland KitchenMarland KitchenEyleen was seen today for annual exam.  Diagnoses and all orders for this visit:  Routine physical examination -     Lipid Panel w/reflex Direct LDL -     COMPLETE METABOLIC PANEL WITH GFR -     TSH -     MM 3D SCREEN BREAST BILATERAL  Visit for screening mammogram -     MM 3D SCREEN BREAST BILATERAL  Screening for diabetes mellitus -     COMPLETE METABOLIC PANEL WITH GFR  Screening for lipid disorders -     Lipid Panel w/reflex Direct LDL  Class 1 obesity due to excess calories without serious  comorbidity with body mass index (BMI) of 32.0 to 32.9 in adult  Need for Tdap vaccination -     Tdap vaccine greater than or equal to 7yo IM  Need for influenza vaccination -     Flu Vaccine QUAD 6+ mos PF IM (Fluarix Quad PF)  .[name] Depression screen [facility] 2/9 04/15/2018 04/26/2016 04/15/2015 05/07/2014  Decreased Interest 0 0 0 0  Down, Depressed, Hopeless 0 0 0 0  PHQ - 2 Score 0 0 0 0   .[name] Discussed 150 minutes of exercise a week.  Encouraged vitamin D 1000 units and Calcium 1300mg  or 4 servings of dairy a day.  Fasting labs ordered.  Mammogram ordered.  Needs pap smear.  cologuard faxed off.  Flu and Tdap given today.  Discussed shingrix.   Discussed switching coumadin over to Eliqus/xaralto. She would like to as her hematologist. He is retiring and we will be taking over management soon. She is very controlled on coumadin. She comes in every 3 months for INR rechecks.   [name].Discussed low carb diet with 1500 calories and 80g of protein.  Exercising at least 150 minutes a week.  [employer] could be a Chief Technology Officer.  Discussed medication options. Pt will call back with what she would like to try.    See After Visit Summary for Counseling Recommendations

## 2018-04-12 NOTE — Progress Notes (Signed)
Call pt: thyroid looks great. Other labs pending.

## 2018-04-12 NOTE — Patient Instructions (Addendum)
Weight loss medication.  Phentermine saxenda contrave Belviq     Health Maintenance for Postmenopausal Women Menopause is a normal process in which your reproductive ability comes to an end. This process happens gradually over a span of months to years, usually between the ages of 17 and 16. Menopause is complete when you have missed 12 consecutive menstrual periods. It is important to talk with your health care provider about some of the most common conditions that affect postmenopausal women, such as heart disease, cancer, and bone loss (osteoporosis). Adopting a healthy lifestyle and getting preventive care can help to promote your health and wellness. Those actions can also lower your chances of developing some of these common conditions. What should I know about menopause? During menopause, you may experience a number of symptoms, such as:  Moderate-to-severe hot flashes.  Night sweats.  Decrease in sex drive.  Mood swings.  Headaches.  Tiredness.  Irritability.  Memory problems.  Insomnia.  Choosing to treat or not to treat menopausal changes is an individual decision that you make with your health care provider. What should I know about hormone replacement therapy and supplements? Hormone therapy products are effective for treating symptoms that are associated with menopause, such as hot flashes and night sweats. Hormone replacement carries certain risks, especially as you become older. If you are thinking about using estrogen or estrogen with progestin treatments, discuss the benefits and risks with your health care provider. What should I know about heart disease and stroke? Heart disease, heart attack, and stroke become more likely as you age. This may be due, in part, to the hormonal changes that your body experiences during menopause. These can affect how your body processes dietary fats, triglycerides, and cholesterol. Heart attack and stroke are both medical  emergencies. There are many things that you can do to help prevent heart disease and stroke:  Have your blood pressure checked at least every 1-2 years. High blood pressure causes heart disease and increases the risk of stroke.  If you are 67-53 years old, ask your health care provider if you should take aspirin to prevent a heart attack or a stroke.  Do not use any tobacco products, including cigarettes, chewing tobacco, or electronic cigarettes. If you need help quitting, ask your health care provider.  It is important to eat a healthy diet and maintain a healthy weight. ? Be sure to include plenty of vegetables, fruits, low-fat dairy products, and lean protein. ? Avoid eating foods that are high in solid fats, added sugars, or salt (sodium).  Get regular exercise. This is one of the most important things that you can do for your health. ? Try to exercise for at least 150 minutes each week. The type of exercise that you do should increase your heart rate and make you sweat. This is known as moderate-intensity exercise. ? Try to do strengthening exercises at least twice each week. Do these in addition to the moderate-intensity exercise.  Know your numbers.Ask your health care provider to check your cholesterol and your blood glucose. Continue to have your blood tested as directed by your health care provider.  What should I know about cancer screening? There are several types of cancer. Take the following steps to reduce your risk and to catch any cancer development as early as possible. Breast Cancer  Practice breast self-awareness. ? This means understanding how your breasts normally appear and feel. ? It also means doing regular breast self-exams. Let your health care provider know  about any changes, no matter how small.  If you are 16 or older, have a clinician do a breast exam (clinical breast exam or CBE) every year. Depending on your age, family history, and medical history, it  may be recommended that you also have a yearly breast X-ray (mammogram).  If you have a family history of breast cancer, talk with your health care provider about genetic screening.  If you are at high risk for breast cancer, talk with your health care provider about having an MRI and a mammogram every year.  Breast cancer (BRCA) gene test is recommended for women who have family members with BRCA-related cancers. Results of the assessment will determine the need for genetic counseling and BRCA1 and for BRCA2 testing. BRCA-related cancers include these types: ? Breast. This occurs in males or females. ? Ovarian. ? Tubal. This may also be called fallopian tube cancer. ? Cancer of the abdominal or pelvic lining (peritoneal cancer). ? Prostate. ? Pancreatic.  Cervical, Uterine, and Ovarian Cancer Your health care provider may recommend that you be screened regularly for cancer of the pelvic organs. These include your ovaries, uterus, and vagina. This screening involves a pelvic exam, which includes checking for microscopic changes to the surface of your cervix (Pap test).  For women ages 21-65, health care providers may recommend a pelvic exam and a Pap test every three years. For women ages 55-65, they may recommend the Pap test and pelvic exam, combined with testing for human papilloma virus (HPV), every five years. Some types of HPV increase your risk of cervical cancer. Testing for HPV may also be done on women of any age who have unclear Pap test results.  Other health care providers may not recommend any screening for nonpregnant women who are considered low risk for pelvic cancer and have no symptoms. Ask your health care provider if a screening pelvic exam is right for you.  If you have had past treatment for cervical cancer or a condition that could lead to cancer, you need Pap tests and screening for cancer for at least 20 years after your treatment. If Pap tests have been discontinued  for you, your risk factors (such as having a new sexual partner) need to be reassessed to determine if you should start having screenings again. Some women have medical problems that increase the chance of getting cervical cancer. In these cases, your health care provider may recommend that you have screening and Pap tests more often.  If you have a family history of uterine cancer or ovarian cancer, talk with your health care provider about genetic screening.  If you have vaginal bleeding after reaching menopause, tell your health care provider.  There are currently no reliable tests available to screen for ovarian cancer.  Lung Cancer Lung cancer screening is recommended for adults 41-59 years old who are at high risk for lung cancer because of a history of smoking. A yearly low-dose CT scan of the lungs is recommended if you:  Currently smoke.  Have a history of at least 30 pack-years of smoking and you currently smoke or have quit within the past 15 years. A pack-year is smoking an average of one pack of cigarettes per day for one year.  Yearly screening should:  Continue until it has been 15 years since you quit.  Stop if you develop a health problem that would prevent you from having lung cancer treatment.  Colorectal Cancer  This type of cancer can be detected and  can often be prevented.  Routine colorectal cancer screening usually begins at age 11 and continues through age 26.  If you have risk factors for colon cancer, your health care provider may recommend that you be screened at an earlier age.  If you have a family history of colorectal cancer, talk with your health care provider about genetic screening.  Your health care provider may also recommend using home test kits to check for hidden blood in your stool.  A small camera at the end of a tube can be used to examine your colon directly (sigmoidoscopy or colonoscopy). This is done to check for the earliest forms of  colorectal cancer.  Direct examination of the colon should be repeated every 5-10 years until age 64. However, if early forms of precancerous polyps or small growths are found or if you have a family history or genetic risk for colorectal cancer, you may need to be screened more often.  Skin Cancer  Check your skin from head to toe regularly.  Monitor any moles. Be sure to tell your health care provider: ? About any new moles or changes in moles, especially if there is a change in a mole's shape or color. ? If you have a mole that is larger than the size of a pencil eraser.  If any of your family members has a history of skin cancer, especially at a young age, talk with your health care provider about genetic screening.  Always use sunscreen. Apply sunscreen liberally and repeatedly throughout the day.  Whenever you are outside, protect yourself by wearing long sleeves, pants, a wide-brimmed hat, and sunglasses.  What should I know about osteoporosis? Osteoporosis is a condition in which bone destruction happens more quickly than new bone creation. After menopause, you may be at an increased risk for osteoporosis. To help prevent osteoporosis or the bone fractures that can happen because of osteoporosis, the following is recommended:  If you are 49-65 years old, get at least 1,000 mg of calcium and at least 600 mg of vitamin D per day.  If you are older than age 60 but younger than age 19, get at least 1,200 mg of calcium and at least 600 mg of vitamin D per day.  If you are older than age 68, get at least 1,200 mg of calcium and at least 800 mg of vitamin D per day.  Smoking and excessive alcohol intake increase the risk of osteoporosis. Eat foods that are rich in calcium and vitamin D, and do weight-bearing exercises several times each week as directed by your health care provider. What should I know about how menopause affects my mental health? Depression may occur at any age, but it  is more common as you become older. Common symptoms of depression include:  Low or sad mood.  Changes in sleep patterns.  Changes in appetite or eating patterns.  Feeling an overall lack of motivation or enjoyment of activities that you previously enjoyed.  Frequent crying spells.  Talk with your health care provider if you think that you are experiencing depression. What should I know about immunizations? It is important that you get and maintain your immunizations. These include:  Tetanus, diphtheria, and pertussis (Tdap) booster vaccine.  Influenza every year before the flu season begins.  Pneumonia vaccine.  Shingles vaccine.  Your health care provider may also recommend other immunizations. This information is not intended to replace advice given to you by your health care provider. Make sure you discuss  any questions you have with your health care provider. Document Released: 08/20/2005 Document Revised: 01/16/2016 Document Reviewed: 04/01/2015 Elsevier Interactive Patient Education  2018 [employer].

## 2018-04-14 ENCOUNTER — Encounter: Payer: Self-pay | Admitting: Physician Assistant

## 2018-04-14 DIAGNOSIS — E7800 Pure hypercholesterolemia, unspecified: Secondary | ICD-10-CM | POA: Insufficient documentation

## 2018-04-14 NOTE — Progress Notes (Signed)
Call pt: LDL is elevated but HDL looks great. 10 yr CV risk 1.3 percent. We do not need to do medication at this point. Continue to work on low fat diet.  Thyroid looks good.  Kidney, liver, glucose looks good.

## 2018-04-15 ENCOUNTER — Encounter: Payer: Self-pay | Admitting: Physician Assistant

## 2018-04-15 ENCOUNTER — Telehealth: Payer: Self-pay | Admitting: Physician Assistant

## 2018-04-15 DIAGNOSIS — E6609 Other obesity due to excess calories: Secondary | ICD-10-CM | POA: Insufficient documentation

## 2018-04-15 DIAGNOSIS — Z6832 Body mass index (BMI) 32.0-32.9, adult: Secondary | ICD-10-CM

## 2018-04-15 NOTE — Telephone Encounter (Signed)
Please fax off for cologuard to be sent her house.

## 2018-04-17 NOTE — Telephone Encounter (Signed)
Done

## 2018-04-25 ENCOUNTER — Other Ambulatory Visit: Payer: Self-pay | Admitting: *Deleted

## 2018-04-25 MED ORDER — COUMADIN 5 MG PO TABS: ORAL_TABLET | ORAL | 5 refills | Status: DC

## 2018-04-25 NOTE — Telephone Encounter (Signed)
Call from pt - needs new rx sent to her pharmacy for Coumadin 5 mg  1.5 tab every day except 1 tab on Fridays ; as stated by Dr Alexandria Lodge on 9/23 visit.

## 2018-05-02 ENCOUNTER — Other Ambulatory Visit (HOSPITAL_COMMUNITY)
Admission: RE | Admit: 2018-05-02 | Discharge: 2018-05-02 | Disposition: A | Discharge status: Home / Self Care | Payer: Commercial Managed Care - PPO | Source: Ambulatory Visit | Attending: Physician Assistant | Admitting: Physician Assistant

## 2018-05-02 ENCOUNTER — Ambulatory Visit (INDEPENDENT_AMBULATORY_CARE_PROVIDER_SITE_OTHER): Payer: Commercial Managed Care - PPO | Admitting: Physician Assistant

## 2018-05-02 ENCOUNTER — Encounter: Payer: Self-pay | Admitting: Physician Assistant

## 2018-05-02 VITALS — BP 124/74 | HR 59 | Ht 66.0 in | Wt 201.0 lb

## 2018-05-02 DIAGNOSIS — Z124 Encounter for screening for malignant neoplasm of cervix: Secondary | ICD-10-CM | POA: Diagnosis present

## 2018-05-02 NOTE — Patient Instructions (Signed)

## 2018-05-02 NOTE — Progress Notes (Signed)
   Subjective:    Patient ID: Sarah Harrington, female    DOB: Oct 07, 1965, 52 y.o.   MRN: [id number]  HPI  Pt is a 52 yo female who presents to the clinic for pap. She had her CPE earlier this month. No hx of abnormal pap. She is unaware of her last pap. She denies any irregular bleeding, painful intercourse.   .. Active Ambulatory Problems    Diagnosis Date Noted  . OTHER AND UNSPECIFIED COAGULATION DEFECTS 01/26/2010  . CONTACT DERMATITIS&OTHER ECZEMA DUE UNSPEC CAUSE 12/10/2009  . EDEMA 01/26/2010  . 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/18/2011  . DVT (deep venous thrombosis) (HCC) 05/18/2011  . Antiphospholipid syndrome (HCC) 06/18/2011  . History of pulmonary embolus (PE) 02/13/2012  . Abnormal mammogram of left breast 08/30/2014  . Elevated LDL cholesterol level 04/14/2018  . Class 1 obesity due to excess calories without serious comorbidity with body mass index (BMI) of 32.0 to 32.9 in adult 04/15/2018   Resolved Ambulatory Problems    Diagnosis Date Noted  . Routine physical examination 08/30/2014   No Additional Past Medical History     Review of Systems  All other systems reviewed and are negative.      Objective:   Physical Exam  Constitutional: She appears well-developed and well-nourished.  Abdominal: Soft. Bowel sounds are normal. She exhibits no distension and no mass. There is no tenderness. There is no rebound and no guarding. No hernia.  Genitourinary: Vagina normal and uterus normal. No vaginal discharge found.  Genitourinary Comments: No adnexal pain or masses.  Cervical os with scant blood.  No cervical polyps.   Neurological: She is alert.  Skin: No rash noted.          Assessment & Plan:  Marland KitchenMarland KitchenDiagnoses and all orders for this visit:  Routine Papanicolaou smear -     Cytology - PAP  pt declined STD screening.  Pt will go down today to schedule mammogram.  Pt reports she sent off cologuard.

## 2018-05-04 ENCOUNTER — Ambulatory Visit (INDEPENDENT_AMBULATORY_CARE_PROVIDER_SITE_OTHER): Payer: Commercial Managed Care - PPO

## 2018-05-04 DIAGNOSIS — Z1231 Encounter for screening mammogram for malignant neoplasm of breast: Secondary | ICD-10-CM

## 2018-05-04 LAB — CYTOLOGY - PAP
Diagnosis: NEGATIVE
HPV: NOT DETECTED

## 2018-05-04 NOTE — Progress Notes (Signed)
Call pt: pap looks great. No HPV or abnormal cells. Can go 5 years before next pap.

## 2018-05-04 NOTE — Progress Notes (Signed)
Call pt: normal mammogram. Follow up in 1 year.

## 2018-05-08 LAB — COLOGUARD: Cologuard: NEGATIVE

## 2018-05-29 ENCOUNTER — Encounter: Payer: Self-pay | Admitting: Physician Assistant

## 2018-06-26 ENCOUNTER — Encounter: Payer: Self-pay | Admitting: Pharmacist

## 2018-06-26 ENCOUNTER — Ambulatory Visit (INDEPENDENT_AMBULATORY_CARE_PROVIDER_SITE_OTHER): Payer: Commercial Managed Care - PPO | Admitting: Pharmacist

## 2018-06-26 DIAGNOSIS — Z7901 Long term (current) use of anticoagulants: Secondary | ICD-10-CM | POA: Diagnosis not present

## 2018-06-26 DIAGNOSIS — I82402 Acute embolism and thrombosis of unspecified deep veins of left lower extremity: Secondary | ICD-10-CM

## 2018-06-26 DIAGNOSIS — D6861 Antiphospholipid syndrome: Secondary | ICD-10-CM

## 2018-06-26 DIAGNOSIS — I82409 Acute embolism and thrombosis of unspecified deep veins of unspecified lower extremity: Secondary | ICD-10-CM | POA: Diagnosis not present

## 2018-06-26 DIAGNOSIS — Z5181 Encounter for therapeutic drug level monitoring: Secondary | ICD-10-CM | POA: Diagnosis not present

## 2018-06-26 LAB — POCT INR: INR: 4.2 — AB (ref 2.0–3.0)

## 2018-06-26 NOTE — Progress Notes (Signed)
Anticoagulation Management Sarah ReilSusan M Percival is a 52 y.o. female who reports to the clinic for monitoring of warfarin treatment.    Indication: Antiphospholipid syndrome, history of DVT, long term current use of anticoagulant.   Duration: indefinite Supervising physician: Cephas DarbyJames Granfortuna  Anticoagulation Clinic Visit History: Patient does not report signs/symptoms of bleeding or thromboembolism  Other recent changes: Yes to diet, medications, lifestyle changes--SEE PATIENT FINDINGS as recorded. Anticoagulation Episode Summary    Current INR goal:   2.0-3.0  TTR:   79.0 % (7 y)  Next INR check:   06/26/2018  INR from last check:   4.2! (06/26/2018)  Weekly max warfarin dose:     Target end date:     INR check location:     Preferred lab:     Send INR reminders to:   [facility]   Indications   Antiphospholipid syndrome (HCC) [D68.61] DVT (deep venous thrombosis) (HCC) [I82.409]       Comments:         Anticoagulation Care Providers    Provider Role Specialty Phone number   Levert FeinsteinGranfortuna, Cherlynn Popiel M, MD Referring Oncology [phone]      No Known Allergies Prior to Admission medications   Medication Sig Start Date End Date Taking? Authorizing Provider  aspirin 81 MG tablet Take 81 mg by mouth daily.     Yes [provider]  COUMADIN 5 MG tablet TAKE 1 AND 1/2 TABLETS BY MOUTH ONCE DAILY, EXCEPT ONLY 1 TABLET ON FRIDAY 04/25/18  Yes Levert FeinsteinGranfortuna, Akeen Ledyard M, MD   Past Medical History:  Diagnosis Date  . History of pulmonary embolus (PE) 02/13/2012   Social History   Socioeconomic History  . Marital status: Married    Spouse name: Not on file  . Number of children: Not on file  . Years of education: Not on file  . Highest education level: Not on file  Occupational History  . Not on file  Social Needs  . Financial resource strain: Not on file  . Food insecurity:    Worry: Not on file    Inability: Not on file  . Transportation needs:    Medical: Not on  file    Non-medical: Not on file  Tobacco Use  . Smoking status: Never Smoker  . Smokeless tobacco: Never Used  Substance and Sexual Activity  . Alcohol use: Yes    Alcohol/week: 0.0 standard drinks  . Drug use: No  . Sexual activity: Not on file  Lifestyle  . Physical activity:    Days per week: Not on file    Minutes per session: Not on file  . Stress: Not on file  Relationships  . Social connections:    Talks on phone: Not on file    Gets together: Not on file    Attends religious service: Not on file    Active member of club or organization: Not on file    Attends meetings of clubs or organizations: Not on file    Relationship status: Not on file  Other Topics Concern  . Not on file  Social History Narrative  . Not on file   Family History  Problem Relation Age of Onset  . Hyperlipidemia Father   . Cancer Maternal Grandfather        prostate  . Cancer Paternal Grandmother        breast  . Heart attack Paternal Grandfather     ASSESSMENT Recent Results: The most recent result is correlated with 50 mg per  week: Lab Results  Component Value Date   INR 4.2 (A) 06/26/2018   INR 2.1 04/03/2018   INR 2.4 01/02/2018   PROTIME 24.0 (H) 03/06/2015    Anticoagulation Dosing: Description   DR [id number] PATIENT (route warfarin notes and list as authorizing provider for LOS & Follow-up, lab orders and warfarin prescriptions), do not list attending physician. Work to keep diet consistent relative to the amount of dark green leafy vegetables. Take one and one-half (1 & 1/2) tablets of your 5mg  peach-colored warfarin tablets every day--EXCEPT on Mondays, Wednesdays and Fridays, take only one (1) tablet of your 5mg  peach-colored warfarin tablets on these days.      INR today: Supratherapeutic  PLAN Weekly dose was decreased by 10% to 45 mg per week  Patient Instructions  Patient instructed to take medications as defined in the Anti-coagulation Track section of this  encounter.  Patient instructed to take today's dose. Patient instructed to  work to keep diet consistent relative to the amount of dark green leafy vegetables. Take one and one-half (1 & 1/2) tablets of your 5mg  peach-colored warfarin tablets every day--EXCEPT on Mondays, Wednesdays and Fridays, take only one (1) tablet of your 5mg  peach-colored warfarin tablets on these days.  Patient verbalized understanding of these instructions.      Patient advised to contact clinic or seek medical attention if signs/symptoms of bleeding or thromboembolism occur.  Patient verbalized understanding by repeating back information and was advised to contact me if further medication-related questions arise. Patient was also provided an information handout.  Follow-up Return in about 3 weeks (around 07/17/2018) for Follow up INR at 0830h.  Elicia Lamp, PharmD, CPP  15 minutes spent face-to-face with the patient during the encounter. 50% of time spent on education. 50% of time was spent on fingerstick point of care INR sample collection, processing, results determination, dose adjustment and documentation in [email or web address].

## 2018-06-26 NOTE — Patient Instructions (Signed)
Patient instructed to take medications as defined in the Anti-coagulation Track section of this encounter.  Patient instructed to take today's dose. Patient instructed to  work to keep diet consistent relative to the amount of dark green leafy vegetables. Take one and one-half (1 & 1/2) tablets of your 5mg  peach-colored warfarin tablets every day--EXCEPT on Mondays, Wednesdays and Fridays, take only one (1) tablet of your 5mg  peach-colored warfarin tablets on these days.  Patient verbalized understanding of these instructions.

## 2018-06-30 ENCOUNTER — Other Ambulatory Visit: Payer: Self-pay | Admitting: Oncology

## 2018-06-30 DIAGNOSIS — D6861 Antiphospholipid syndrome: Secondary | ICD-10-CM

## 2018-06-30 DIAGNOSIS — I82402 Acute embolism and thrombosis of unspecified deep veins of left lower extremity: Secondary | ICD-10-CM

## 2018-06-30 DIAGNOSIS — Z86711 Personal history of pulmonary embolism: Secondary | ICD-10-CM

## 2018-06-30 DIAGNOSIS — Z7901 Long term (current) use of anticoagulants: Secondary | ICD-10-CM

## 2018-07-17 ENCOUNTER — Ambulatory Visit: Payer: Commercial Managed Care - PPO

## 2018-08-28 ENCOUNTER — Ambulatory Visit: Payer: 59 | Admitting: Oncology

## 2018-09-26 ENCOUNTER — Encounter: Payer: Self-pay | Admitting: Oncology

## 2018-09-26 ENCOUNTER — Ambulatory Visit (INDEPENDENT_AMBULATORY_CARE_PROVIDER_SITE_OTHER): Payer: 59 | Admitting: Oncology

## 2018-09-26 ENCOUNTER — Other Ambulatory Visit: Payer: Self-pay

## 2018-09-26 ENCOUNTER — Encounter: Payer: Self-pay | Admitting: *Deleted

## 2018-09-26 VITALS — BP 145/94 | HR 64 | Temp 98.7°F | Ht 66.0 in | Wt 203.8 lb

## 2018-09-26 DIAGNOSIS — D6861 Antiphospholipid syndrome: Secondary | ICD-10-CM | POA: Diagnosis not present

## 2018-09-26 DIAGNOSIS — Z86711 Personal history of pulmonary embolism: Secondary | ICD-10-CM | POA: Diagnosis not present

## 2018-09-26 DIAGNOSIS — Z86718 Personal history of other venous thrombosis and embolism: Secondary | ICD-10-CM

## 2018-09-26 DIAGNOSIS — Z8679 Personal history of other diseases of the circulatory system: Secondary | ICD-10-CM

## 2018-09-26 DIAGNOSIS — Z5181 Encounter for therapeutic drug level monitoring: Secondary | ICD-10-CM

## 2018-09-26 DIAGNOSIS — I82402 Acute embolism and thrombosis of unspecified deep veins of left lower extremity: Secondary | ICD-10-CM | POA: Diagnosis not present

## 2018-09-26 DIAGNOSIS — Z7901 Long term (current) use of anticoagulants: Secondary | ICD-10-CM | POA: Diagnosis not present

## 2018-09-26 LAB — POCT INR: INR: 3.6 — AB (ref 2.0–3.0)

## 2018-09-26 NOTE — Progress Notes (Signed)
Hematology and Oncology Follow Up Visit  Sarah Harrington [id number] March 09, 1966 53 y.o. 09/26/2018 10:31 AM   Principle Diagnosis: Encounter Diagnoses  Name Primary?  . Deep vein thrombosis (DVT) of left lower extremity, unspecified chronicity, unspecified vein (HCC)   . Antiphospholipid antibody syndrome (HCC) Yes  . 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 She was also on oral contraceptives. 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 The lupus anticoagulant has been variably positive over the years. The IgA antibody has been persistently elevated. I stopped her anticoagulation on 08/01/06 and just put her on one aspirin a day. Unfortunately, she developed left calf pain and swelling and was evaluated on 08/31/07 with findings of extensive superficial thrombosis of the greater saphenous vein with calf veins also being thrombosed. She was put back on full dose anticoagulation at that time and has had no subsequent thrombotic events.  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 A follow-up Doppler study done on 10/28/08 did not show any recurrent clot at that time. D-dimer testing done 08/31/07 remained elevated at 1.73, normal less than 0.48 but a follow-up study done 10/28/08 had normalized down to 0.22., 0.11 on 11/19/09. She had laser vein surgery in the past with a good outcome.   Interim History: She continues to do well.  She has had no intermittent medical problems. She is currently on Coumadin 5 mg Monday Wednesday Fridays and 7.5 mg on the other days of the week.  She denies any bleeding issues specifically no bleeding gums, epistaxis, no hematuria, hematochezia, or  vaginal bleeding. She denies any dyspnea, chest pain, palpitations, calf pain or tenderness. She is up-to-date with her health maintenance.  She had normal mammograms October 2019, normal Pap smear at that time, and a Cologuard study which was also negative. Her 3 children are grown and doing well.  2 sons and a daughter.  Everybody home now because of the coronavirus.  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  Medications: reviewed  Allergies: No Known Allergies  Review of Systems:  Remaining ROS negative:   Physical Exam: Blood pressure (!) 145/94, pulse 64, temperature 98.7 F (37.1 C), temperature source Oral, height 5\' 6"  (1.676 m), weight 203 lb 12.8 oz (92.4 kg), SpO2 98 %. Wt Readings from Last 3 Encounters:  09/26/18 203 lb 12.8 oz (92.4 kg)  05/02/18 201 lb (91.2 kg)  04/12/18 201 lb (91.2 kg)     General appearance: Well-nourished Caucasian woman HENNT: Pharynx no erythema, exudate, mass, or ulcer. No thyromegaly or thyroid nodules Lymph nodes: No cervical, supraclavicular, or axillary lymphadenopathy Breasts: Not examined as good examination) was it helpful Lungs: Clear to auscultation, resonant to percussion throughout Heart: Regular rhythm, no murmur, no gallop, no rub, no click, no edema Abdomen: Soft, nontender, normal bowel sounds, no mass, no organomegaly Extremities: No edema, no calf tenderness Musculoskeletal: no joint deformities GU:  Vascular: Carotid pulses 2+, no bruits, distal pulses: Dorsalis pedis 1+ symmetric Neurologic: Alert, oriented, PERRLA, optic discs sharp and vessels normal, no hemorrhage or exudate, cranial nerves grossly normal, motor strength 5 over 5, reflexes 1+ symmetric, upper body coordination normal, gait normal, Skin: No rash or  ecchymosis  Lab Results: CBC W/Diff    Component Value Date/Time   WBC 5.4 12/15/2015 1021   WBC 4.1 08/27/2014 0834   RBC 4.26 12/15/2015 1021   RBC 4.51 08/27/2014 0834   HGB 13.3  12/15/2015 1021   HGB 13.9 08/27/2014 0834   HCT 39.8 12/15/2015 1021   HCT 42.3 08/27/2014 0834   PLT 334 12/15/2015 1021   MCV 93 12/15/2015 1021   MCV 93.8 08/27/2014 0834   MCH 31.2 12/15/2015 1021   MCH 30.8 08/27/2014 0834   MCHC 33.4 12/15/2015 1021   MCHC 32.9 08/27/2014 0834   RDW 13.6 12/15/2015 1021   RDW 13.1 08/27/2014 0834   LYMPHSABS 1.7 12/15/2015 1021   LYMPHSABS 1.1 08/27/2014 0834   MONOABS 0.3 08/27/2014 0834   EOSABS 0.1 12/15/2015 1021   BASOSABS 0.0 12/15/2015 1021   BASOSABS 0.0 08/27/2014 0834     Chemistry      Component Value Date/Time   NA 136 04/12/2018 1012   NA 136 12/15/2015 1021   NA 138 08/27/2014 0838   K 4.7 04/12/2018 1012   K 4.1 08/27/2014 0838   CL 104 04/12/2018 1012   CO2 23 04/12/2018 1012   CO2 22 08/27/2014 0838   BUN 10 04/12/2018 1012   BUN 10 12/15/2015 1021   BUN 10.5 08/27/2014 0838   CREATININE 0.74 04/12/2018 1012   CREATININE 0.7 08/27/2014 0838      Component Value Date/Time   CALCIUM 9.0 04/12/2018 1012   CALCIUM 8.6 08/27/2014 0838   ALKPHOS 36 (L) 12/15/2015 1021   ALKPHOS 34 (L) 08/27/2014 0838   AST 18 04/12/2018 1012   AST 15 08/27/2014 0838   ALT 14 04/12/2018 1012   ALT 13 08/27/2014 0838   BILITOT 0.5 04/12/2018 1012   BILITOT <0.2 12/15/2015 1021   BILITOT 0.21 08/27/2014 9450       Radiological Studies: No results found.  Impression:  1.  Antiphospholipid antibody syndrome  2.  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  3.  Chronic warfarin anticoagulation secondary to #1 and 2. Recent prospective data shows that the oral Xa inhibitors do not appear to give adequate protection against recurrent thrombotic events but in particular arterial events so I would recommend indefinite warfarin anticoagulation unless and until an alternative anticoagulant other than those currently available is developed.  4.  History of postphlebitic  syndrome.  Resolved.  I am transitioning her ongoing hematology care back to the [facility] cancer center with Dr. Mancel Bale. She has made arrangements to have her warfarin monitored through her primary care physician's office.   CC: Patient Care Team: Nolene Ebbs as PCP - General (Family Medicine) Quenton Fetter, hematology oncology   Cephas Darby, MD, FACP  Hematology-Oncology/Internal Medicine     3/17/202010:31 AM

## 2018-09-26 NOTE — Patient Instructions (Signed)
To lab today Continue coumadin at current dose unless otherwise instructed  Referral made to Dr Mancel Bale at the cancer center - they should call you for an appointment if not, call [phone]  It has been a pleasure working with you!!  DrG

## 2018-09-27 LAB — CBC WITH DIFFERENTIAL/PLATELET
Basophils Absolute: 0.1 10*3/uL (ref 0.0–0.2)
Basos: 2 %
EOS (ABSOLUTE): 0.2 10*3/uL (ref 0.0–0.4)
Eos: 5 %
Hematocrit: 42.6 % (ref 34.0–46.6)
Hemoglobin: 14.2 g/dL (ref 11.1–15.9)
IMMATURE GRANS (ABS): 0 10*3/uL (ref 0.0–0.1)
Immature Granulocytes: 0 %
LYMPHS: 35 %
Lymphocytes Absolute: 1.6 10*3/uL (ref 0.7–3.1)
MCH: 30.3 pg (ref 26.6–33.0)
MCHC: 33.3 g/dL (ref 31.5–35.7)
MCV: 91 fL (ref 79–97)
MONOCYTES: 9 %
Monocytes Absolute: 0.4 10*3/uL (ref 0.1–0.9)
Neutrophils Absolute: 2.3 10*3/uL (ref 1.4–7.0)
Neutrophils: 49 %
Platelets: 338 10*3/uL (ref 150–450)
RBC: 4.68 x10E6/uL (ref 3.77–5.28)
RDW: 14.3 % (ref 11.7–15.4)
WBC: 4.5 10*3/uL (ref 3.4–10.8)

## 2018-09-28 ENCOUNTER — Telehealth: Payer: Self-pay | Admitting: *Deleted

## 2018-09-28 NOTE — Telephone Encounter (Signed)
Called pt - no answer; left message of self identified mailbox "CBC normal; no anemia " per Dr Loni Muse. And call me back for any questions.

## 2018-09-28 NOTE — Telephone Encounter (Signed)
-----   Message from Levert Feinstein, MD sent at 09/27/2018  8:53 AM EDT ----- Call pt: CBC normal; no anemia

## 2018-11-03 ENCOUNTER — Other Ambulatory Visit: Payer: Self-pay | Admitting: Hematology

## 2018-11-03 DIAGNOSIS — D6861 Antiphospholipid syndrome: Secondary | ICD-10-CM

## 2018-11-10 ENCOUNTER — Inpatient Hospital Stay: Payer: 59

## 2018-11-10 ENCOUNTER — Ambulatory Visit: Payer: 59 | Admitting: Hematology

## 2018-11-16 ENCOUNTER — Telehealth: Payer: Self-pay | Admitting: Hematology

## 2018-11-16 ENCOUNTER — Inpatient Hospital Stay (HOSPITAL_BASED_OUTPATIENT_CLINIC_OR_DEPARTMENT_OTHER): Payer: 59 | Admitting: Hematology

## 2018-11-16 ENCOUNTER — Encounter: Payer: Self-pay | Admitting: Hematology

## 2018-11-16 ENCOUNTER — Other Ambulatory Visit: Payer: Self-pay

## 2018-11-16 ENCOUNTER — Inpatient Hospital Stay: Payer: 59 | Attending: Hematology

## 2018-11-16 VITALS — BP 138/81 | HR 59 | Temp 98.5°F | Resp 18 | Ht 66.0 in | Wt 211.0 lb

## 2018-11-16 DIAGNOSIS — D6861 Antiphospholipid syndrome: Secondary | ICD-10-CM | POA: Insufficient documentation

## 2018-11-16 DIAGNOSIS — Z86718 Personal history of other venous thrombosis and embolism: Secondary | ICD-10-CM | POA: Insufficient documentation

## 2018-11-16 DIAGNOSIS — Z7901 Long term (current) use of anticoagulants: Secondary | ICD-10-CM | POA: Diagnosis not present

## 2018-11-16 DIAGNOSIS — I82402 Acute embolism and thrombosis of unspecified deep veins of left lower extremity: Secondary | ICD-10-CM

## 2018-11-16 DIAGNOSIS — Z86711 Personal history of pulmonary embolism: Secondary | ICD-10-CM

## 2018-11-16 LAB — CBC WITH DIFFERENTIAL (CANCER CENTER ONLY)
Abs Immature Granulocytes: 0.02 10*3/uL (ref 0.00–0.07)
Basophils Absolute: 0.1 10*3/uL (ref 0.0–0.1)
Basophils Relative: 1 %
Eosinophils Absolute: 0.2 10*3/uL (ref 0.0–0.5)
Eosinophils Relative: 3 %
HCT: 39.6 % (ref 36.0–46.0)
Hemoglobin: 13 g/dL (ref 12.0–15.0)
Immature Granulocytes: 0 %
Lymphocytes Relative: 27 %
Lymphs Abs: 1.4 10*3/uL (ref 0.7–4.0)
MCH: 30.3 pg (ref 26.0–34.0)
MCHC: 32.8 g/dL (ref 30.0–36.0)
MCV: 92.3 fL (ref 80.0–100.0)
Monocytes Absolute: 0.5 10*3/uL (ref 0.1–1.0)
Monocytes Relative: 11 %
Neutro Abs: 3 10*3/uL (ref 1.7–7.7)
Neutrophils Relative %: 58 %
Platelet Count: 321 10*3/uL (ref 150–400)
RBC: 4.29 MIL/uL (ref 3.87–5.11)
RDW: 14 % (ref 11.5–15.5)
WBC Count: 5.1 10*3/uL (ref 4.0–10.5)
nRBC: 0 % (ref 0.0–0.2)

## 2018-11-16 LAB — CMP (CANCER CENTER ONLY)
ALT: 12 U/L (ref 0–44)
AST: 16 U/L (ref 15–41)
Albumin: 3.9 g/dL (ref 3.5–5.0)
Alkaline Phosphatase: 41 U/L (ref 38–126)
Anion gap: 6 (ref 5–15)
BUN: 13 mg/dL (ref 6–20)
CO2: 28 mmol/L (ref 22–32)
Calcium: 9 mg/dL (ref 8.9–10.3)
Chloride: 104 mmol/L (ref 98–111)
Creatinine: 0.78 mg/dL (ref 0.44–1.00)
GFR, Est AFR Am: >60 mL/min (ref >60)
GFR, Estimated: >60 mL/min (ref >60)
Glucose, Bld: 95 mg/dL (ref 70–99)
Potassium: 4.2 mmol/L (ref 3.5–5.1)
Sodium: 138 mmol/L (ref 135–145)
Total Bilirubin: 0.4 mg/dL (ref 0.3–1.2)
Total Protein: 6.8 g/dL (ref 6.5–8.1)

## 2018-11-16 LAB — PROTIME-INR
INR: 2.3 — ABNORMAL HIGH (ref 0.8–1.2)
Prothrombin Time: 25.2 seconds — ABNORMAL HIGH (ref 11.4–15.2)

## 2018-11-16 NOTE — Telephone Encounter (Signed)
Appointments scheduled letter/calendar mailed per 5/7 los °

## 2018-11-16 NOTE — Progress Notes (Addendum)
[facility] CONSULT NOTE  Patient Care Team: Nolene Ebbs as PCP - General (Family Medicine)  HEME/ONC OVERVIEW: 1. Antiphospholipid syndrome with hx of DVT and PTE -Previous patient of Dr. Cyndie Chime  -02/2001: LLE DVT and acute PTE, presumed provoked due to a long car ride and being on oral contraceptives -APLS work-up:  2007: elevated anticardiolipin IgA and IgM  2008: elevated beta-2-glycoprotein IgA  -07/2006: anticoagulation discontinued; started ppx ASA  -08/2007: extensive superficial thrombosis of the greater saphenous vein and calf veins; anticoagulation resumed (warfarin)  TREATMENT REGIMEN:  08/2007 - present: warfarin; goal INR 2-3, managed by PCP   ASSESSMENT & PLAN:   Antiphospholipid syndrome with hx of DVT and PTE -I reviewed the patient's records in detail, including PCP clinic notes, lab studies, and imaging results -In summary, patient has a remote history of left lower extremity DVT and PTE in 2002, which were thought to be provoked in the setting of pain on oral contraceptives and after a long car ride.  She was treated with warfarin for nearly 6 years, which was discontinued in 07/2006 and placed on prophylactic aspirin.  Her hypercoagulable work-up was notable for elevated anticardiolipin IgA and IgM in 2007, and elevated beta-2 glycoprotein IgA in 2008.  Unfortunately, she developed extensive superficial thrombosis of the greater saphenous vein as well as calf veins in 08/2007, and anticoagulation (warfarin) was resumed at that time.  Since then, patient has not had any recurrent VTE. -I reviewed with the patient about the plan for care for DVT and PTE in the setting of antiphospholipid syndrome. -In light of recurrent VTE, the goal of anticoagulation is lifelong -Patient has been on warfarin since 2009 and tolerating it well without any significant bleeding or bruising; her INR had been monitored by Dr. Cyndie Chime, who retired in  10/2018; patient has made arrangement for INR monitoring at her PCP's office -We discussed about various options of anticoagulation therapies including warfarin, low molecular weight heparin such as Lovenox or direct oral anticoagulants such as rivaroxaban and apixiban. Some of the risks and benefits discussed including costs involved, the need for monitoring, risks of life-threatening bleeding/hospitalization, reversibility of each agent in the event of bleeding or overdose, safety profile of each drug and taking into account other social issues such as ease of administration of medications, etc.  -In reviewing the literature on anticoagulants in patients with history of APLS as well as discussion with Dr. Cyndie Chime, there are some studies suggesting that DOAC's, such as Eliquis or Xarelto, are less effective in preventing thrombosis than warfarin, particularly among patients who are considered hgih risk (ie. Test positive for all three aPL antibodies and with hx of arterial thrombosis) -Therefore, as the patient has been tolerating warfarin relatively well without significant adverse side effects, we will continue warfarin for now (goal INR 2-3) -If she develops intolerance of warfarin in the future, we can consider repeating aPL testing to see if she is at high risk for developing recurrent thrombosis and determine her eligibility for DOAC's -I recommend the patient to use elastic compression stockings at 20-30 mmHg to reduce risks of chronic thrombophlebitis. -Finally, I reinforced the importance of preventive strategies such as avoiding hormonal supplement, avoiding cigarette smoking, keeping up-to-date with screening programs for early cancer detection, frequent ambulation for long distance travel and aggressive DVT prophylaxis in all surgical settings. -Should she need any interruption of the anticoagulation for elective procedures in the future, feel free to contact me regarding peri-operative  management.  Orders Placed This Encounter  Procedures  . CBC with Differential (Cancer Center Only)    Standing Status:   Future    Standing Expiration Date:   12/21/2019  . CMP (Cancer Center only)    Standing Status:   Future    Standing Expiration Date:   12/21/2019  . Protime-INR    Standing Status:   Future    Standing Expiration Date:   12/21/2019    A total of more than 45 minutes were spent face-to-face with the patient during this encounter and over half of that time was spent on counseling and coordination of care as outlined above.    All questions were answered. The patient knows to call the clinic with any problems, questions or concerns.  Return in 6 months for labs and clinic follow-up. Sooner if the patient elects to change to Eliquis or Xarelto.   Arthur Holms, MD 11/16/2018 12:53 PM   CHIEF COMPLAINTS/PURPOSE OF CONSULTATION:  "I am doing fine"  HISTORY OF PRESENTING ILLNESS:  Sarah Harrington 53 y.o. female is here because of history of DVT and PTE in the setting of antiphospholipid syndrome.  Patient was previously followed by Dr. Cyndie Chime, who has retired.  Patient has a remote history of left lower extremity DVT and PTE in 2002, which were thought to be provoked in the setting of pain on oral contraceptives and after a long car ride.  She was treated with warfarin for nearly 6 years, which was discontinued in 07/2006 and placed on prophylactic aspirin.  Her hypercoagulable work-up was notable for elevated anticardiolipin IgA and IgM in 2007, and elevated beta-2 glycoprotein IgA in 2008.  Unfortunately, she developed extensive superficial thrombosis of the greater saphenous vein as well as calf veins in 08/2007, and anticoagulation (warfarin) was resumed at that time.  Since then, patient has not had any recurrent VTE.  She is currently on warfarin  MWF, and 7.5mg  on other days of the week. She denies any abnormal bleeding or bruising. She denies any chest pain,  dyspnea, cough, hemoptysis, calf pain or tenderness. She is up-to-date with her cancer screening. She works as an Pensions consultant.   MEDICAL HISTORY:  Past Medical History:  Diagnosis Date  . History of pulmonary embolus (PE) 02/13/2012    SURGICAL HISTORY: Past Surgical History:  Procedure Laterality Date  . CESAREAN SECTION    . TUBAL LIGATION      SOCIAL HISTORY: Social History   Socioeconomic History  . Marital status: Married    Spouse name: Not on file  . Number of children: Not on file  . Years of education: Not on file  . Highest education level: Not on file  Occupational History  . Not on file  Social Needs  . Financial resource strain: Not on file  . Food insecurity:    Worry: Not on file    Inability: Not on file  . Transportation needs:    Medical: Not on file    Non-medical: Not on file  Tobacco Use  . Smoking status: Never Smoker  . Smokeless tobacco: Never Used  Substance and Sexual Activity  . Alcohol use: Yes    Alcohol/week: 0.0 standard drinks  . Drug use: No  . Sexual activity: Not on file  Lifestyle  . Physical activity:    Days per week: Not on file    Minutes per session: Not on file  . Stress: Not on file  Relationships  . Social connections:    Talks on  phone: Not on file    Gets together: Not on file    Attends religious service: Not on file    Active member of club or organization: Not on file    Attends meetings of clubs or organizations: Not on file    Relationship status: Not on file  . Intimate partner violence:    Fear of current or ex partner: Not on file    Emotionally abused: Not on file    Physically abused: Not on file    Forced sexual activity: Not on file  Other Topics Concern  . Not on file  Social History Narrative  . Not on file    FAMILY HISTORY: Family History  Problem Relation Age of Onset  . Hyperlipidemia Father   . Cancer Maternal Grandfather        prostate  . Cancer Paternal Grandmother        breast  .  Heart attack Paternal Grandfather     ALLERGIES:  has No Known Allergies.  MEDICATIONS:  Current Outpatient Medications  Medication Sig Dispense Refill  . aspirin 81 MG tablet Take 81 mg by mouth daily.      [name] COUMADIN 5 MG tablet TAKE 1 AND 1/2 TABLETS BY MOUTH ONCE DAILY, EXCEPT ONLY 1 TABLET ON FRIDAY 126 tablet 5   No current facility-administered medications for this visit.     REVIEW OF SYSTEMS:   Constitutional: ( - ) fevers, ( - )  chills , ( - ) night sweats Eyes: ( - ) blurriness of vision, ( - ) double vision, ( - ) watery eyes Ears, nose, mouth, throat, and face: ( - ) mucositis, ( - ) sore throat Respiratory: ( - ) cough, ( - ) dyspnea, ( - ) wheezes Cardiovascular: ( - ) palpitation, ( - ) chest discomfort, ( - ) lower extremity swelling Gastrointestinal:  ( - ) nausea, ( - ) heartburn, ( - ) change in bowel habits Skin: ( - ) abnormal skin rashes Lymphatics: ( - ) new lymphadenopathy, ( - ) easy bruising Neurological: ( - ) numbness, ( - ) tingling, ( - ) new weaknesses Behavioral/Psych: ( - ) mood change, ( - ) new changes  All other systems were reviewed with the patient and are negative.  PHYSICAL EXAMINATION: ECOG PERFORMANCE STATUS: 0 - Asymptomatic  Vitals:   11/16/18 1223  BP: 138/81  Pulse: (!) 59  Resp: 18  Temp: 98.5 F (36.9 C)  SpO2: 100%   Filed Weights   11/16/18 1223  Weight: 211 lb (95.7 kg)    GENERAL: alert, no distress and comfortable SKIN: skin color, texture, turgor are normal, no rashes or significant lesions EYES: conjunctiva are pink and non-injected, sclera clear OROPHARYNX: no exudate, no erythema; lips, buccal mucosa, and tongue normal  NECK: supple, non-tender LUNGS: clear to auscultation with normal breathing effort HEART: regular rate & rhythm, no murmurs, no lower extremity edema ABDOMEN: soft, non-tender, non-distended, normal bowel sounds Musculoskeletal: no cyanosis of digits and no clubbing  PSYCH: alert & oriented  x 3, fluent speech NEURO: no focal motor/sensory deficits  LABORATORY DATA:  I have reviewed the data as listed Lab Results  Component Value Date   WBC 5.1 11/16/2018   HGB 13.0 11/16/2018   HCT 39.6 11/16/2018   MCV 92.3 11/16/2018   PLT 321 11/16/2018   Lab Results  Component Value Date   NA 138 11/16/2018   K 4.2 11/16/2018   CL 104 11/16/2018  CO2 28 11/16/2018    RADIOGRAPHIC STUDIES: I have personally reviewed the radiological images as listed and agreed with the findings in the report.  PATHOLOGY: I have reviewed the pathology reports as documented in the oncologist history.

## 2018-11-20 ENCOUNTER — Other Ambulatory Visit: Payer: Self-pay | Admitting: Hematology

## 2018-11-20 DIAGNOSIS — D6861 Antiphospholipid syndrome: Secondary | ICD-10-CM

## 2019-02-05 ENCOUNTER — Encounter: Payer: Self-pay | Admitting: Hematology

## 2019-02-05 ENCOUNTER — Other Ambulatory Visit: Payer: Self-pay | Admitting: *Deleted

## 2019-02-05 DIAGNOSIS — I82402 Acute embolism and thrombosis of unspecified deep veins of left lower extremity: Secondary | ICD-10-CM

## 2019-02-05 DIAGNOSIS — Z86711 Personal history of pulmonary embolism: Secondary | ICD-10-CM

## 2019-02-05 DIAGNOSIS — D6861 Antiphospholipid syndrome: Secondary | ICD-10-CM

## 2019-02-05 MED ORDER — WARFARIN SODIUM 5 MG PO TABS: ORAL_TABLET | ORAL | 2 refills | Status: DC

## 2019-03-22 ENCOUNTER — Telehealth: Payer: Self-pay | Admitting: Hematology

## 2019-03-22 NOTE — Telephone Encounter (Signed)
Called and LMVM for patient regarding appointments being rescheduled due to provider PAL 11/6

## 2019-05-15 ENCOUNTER — Inpatient Hospital Stay (HOSPITAL_BASED_OUTPATIENT_CLINIC_OR_DEPARTMENT_OTHER): Payer: 59 | Admitting: Hematology

## 2019-05-15 ENCOUNTER — Inpatient Hospital Stay: Payer: 59 | Attending: Hematology

## 2019-05-15 ENCOUNTER — Telehealth: Payer: Self-pay | Admitting: Hematology

## 2019-05-15 ENCOUNTER — Other Ambulatory Visit: Payer: Self-pay

## 2019-05-15 ENCOUNTER — Encounter: Payer: Self-pay | Admitting: Hematology

## 2019-05-15 VITALS — BP 151/75 | HR 60 | Temp 97.1°F | Resp 17 | Wt 213.8 lb

## 2019-05-15 DIAGNOSIS — I82402 Acute embolism and thrombosis of unspecified deep veins of left lower extremity: Secondary | ICD-10-CM

## 2019-05-15 DIAGNOSIS — L299 Pruritus, unspecified: Secondary | ICD-10-CM | POA: Diagnosis not present

## 2019-05-15 DIAGNOSIS — D6861 Antiphospholipid syndrome: Secondary | ICD-10-CM | POA: Diagnosis not present

## 2019-05-15 DIAGNOSIS — Z86718 Personal history of other venous thrombosis and embolism: Secondary | ICD-10-CM | POA: Diagnosis not present

## 2019-05-15 DIAGNOSIS — Z7982 Long term (current) use of aspirin: Secondary | ICD-10-CM | POA: Insufficient documentation

## 2019-05-15 DIAGNOSIS — Z7901 Long term (current) use of anticoagulants: Secondary | ICD-10-CM | POA: Insufficient documentation

## 2019-05-15 DIAGNOSIS — Z86711 Personal history of pulmonary embolism: Secondary | ICD-10-CM

## 2019-05-15 LAB — CMP (CANCER CENTER ONLY)
ALT: 16 U/L (ref 0–44)
AST: 22 U/L (ref 15–41)
Albumin: 4.2 g/dL (ref 3.5–5.0)
Alkaline Phosphatase: 35 U/L — ABNORMAL LOW (ref 38–126)
Anion gap: 8 (ref 5–15)
BUN: 12 mg/dL (ref 6–20)
CO2: 26 mmol/L (ref 22–32)
Calcium: 9.1 mg/dL (ref 8.9–10.3)
Chloride: 104 mmol/L (ref 98–111)
Creatinine: 0.81 mg/dL (ref 0.44–1.00)
GFR, Est AFR Am: >60 mL/min (ref >60)
GFR, Estimated: >60 mL/min (ref >60)
Glucose, Bld: 95 mg/dL (ref 70–99)
Potassium: 4.1 mmol/L (ref 3.5–5.1)
Sodium: 138 mmol/L (ref 135–145)
Total Bilirubin: 0.4 mg/dL (ref 0.3–1.2)
Total Protein: 7 g/dL (ref 6.5–8.1)

## 2019-05-15 LAB — CBC WITH DIFFERENTIAL (CANCER CENTER ONLY)
Abs Immature Granulocytes: 0.01 10*3/uL (ref 0.00–0.07)
Basophils Absolute: 0.1 10*3/uL (ref 0.0–0.1)
Basophils Relative: 1 %
Eosinophils Absolute: 0.1 10*3/uL (ref 0.0–0.5)
Eosinophils Relative: 2 %
HCT: 41.1 % (ref 36.0–46.0)
Hemoglobin: 13.4 g/dL (ref 12.0–15.0)
Immature Granulocytes: 0 %
Lymphocytes Relative: 27 %
Lymphs Abs: 1.7 10*3/uL (ref 0.7–4.0)
MCH: 29.4 pg (ref 26.0–34.0)
MCHC: 32.6 g/dL (ref 30.0–36.0)
MCV: 90.1 fL (ref 80.0–100.0)
Monocytes Absolute: 0.5 10*3/uL (ref 0.1–1.0)
Monocytes Relative: 8 %
Neutro Abs: 3.7 10*3/uL (ref 1.7–7.7)
Neutrophils Relative %: 62 %
Platelet Count: 150 10*3/uL (ref 150–400)
RBC: 4.56 MIL/uL (ref 3.87–5.11)
RDW: 14.6 % (ref 11.5–15.5)
WBC Count: 6.1 10*3/uL (ref 4.0–10.5)
nRBC: 0 % (ref 0.0–0.2)

## 2019-05-15 LAB — PROTIME-INR
INR: 2.3 — ABNORMAL HIGH (ref 0.8–1.2)
Prothrombin Time: 25.1 seconds — ABNORMAL HIGH (ref 11.4–15.2)

## 2019-05-15 NOTE — Telephone Encounter (Signed)
Appointments scheduled calendar declined due to my chart access per 11/3 los

## 2019-05-15 NOTE — Progress Notes (Signed)
[facility] OFFICE PROGRESS NOTE  Patient Care Team: Nolene EbbsBreeback, Jade L, PA-C as PCP - General (Family Medicine)  HEME/ONC OVERVIEW: 1. Antiphospholipid syndrome with hx of DVT and PTE -Previous patient of Dr. Cyndie ChimeGranfortuna  -02/2001: LLE DVT and acute PTE, presumed provoked due to a long car ride and being on oral contraceptives -APLS work-up:  2007: elevated anticardiolipin IgA and IgM  2008: elevated beta-2-glycoprotein IgA  -07/2006: anticoagulation discontinued; started ppx ASA  -08/2007: extensive superficial thrombosis of the greater saphenous vein and calf veins; anticoagulation resumed (warfarin)  TREATMENT REGIMEN:  08/2007 - present: warfarin; goal INR 2-3, managed by PCP   ASSESSMENT & PLAN:   Antiphospholipid syndrome with hx of DVT and PTE -Currently on warfarin (since ~2009) without any abnormal bleeding or bruising  -INR 2.3 today; goal INR 2-3  -As patient had relatively mild elevation of the antiphospholipid syndrome panel, I have ordered repeat panel today to see if these proteins remain elevated -If they are normal on a few visits, then that may suggest an idiopathic hypercoagulable syndrome rather than APLS -This would NOT necessarily affect her current regimen, as she will require lifelong anticoagulation and can remain on warfarin as long as she is tolerating it well, but in the event that she does have to make a change in her anticoagulation regimen (such as intolerance, fluctuating INR, etc), that may give her other options, such as Xarelto or Eliquis  -Continue compression stockings as needed to reduce lower extremity swelling  -Should she need any interruption of the anticoagulation for elective procedures in the future, feel free to contact me regarding peri-operative management.  Intermittent pruritus -Patient reports intermittent pruritus over the elbow -No definite rash on exam -I recommend a trial of OTC lotion and hydrocortisone  cream -If no improvement, patient is instructed to follow up with PCP for further evaluation   Orders Placed This Encounter  Procedures  . CBC with Differential (Cancer Center Only)    Standing Status:   Future    Standing Expiration Date:   06/18/2020  . CMP (Cancer Center only)    Standing Status:   Future    Standing Expiration Date:   06/18/2020  . Protime-INR    Standing Status:   Future    Standing Expiration Date:   06/18/2020   All questions were answered. The patient knows to call the clinic with any problems, questions or concerns. No barriers to learning was detected.  A total of more than 15 minutes were spent face-to-face with the patient during this encounter and over half of that time was spent on counseling and coordination of care as outlined above.   Return in 6 months for labs and clinic follow-up.  Arthur HolmsYan Edris Friedt, MD 05/15/2019 1:49 PM  CHIEF COMPLAINT: "I am doing fine"  INTERVAL HISTORY: Ms. Sarah Harrington returns to clinic for follow-up of history of DVT and PTE, thought to be secondary to antiphospholipid syndrome. Patient is compliant with her warfarin and denies any abnormal bleeding or excess bruising. 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 She forgot to get her INR checked every 3 months at her PCPs office. She denies any other complaint today.  REVIEW OF SYSTEMS:   Constitutional: ( - ) fevers, ( - )  chills , ( - ) night sweats Eyes: ( - ) blurriness of vision, ( - ) double vision, ( - ) watery eyes Ears, nose, mouth, throat, and face: ( - ) mucositis, ( - )  sore throat Respiratory: ( - ) cough, ( - ) dyspnea, ( - ) wheezes Cardiovascular: ( - ) palpitation, ( - ) chest discomfort, ( - ) lower extremity swelling Gastrointestinal:  ( - ) nausea, ( - ) heartburn, ( - ) change in bowel habits Skin: ( - ) abnormal skin rashes Lymphatics: ( - ) new lymphadenopathy, ( - ) easy bruising Neurological: ( - ) numbness, ( - ) tingling, (  - ) new weaknesses Behavioral/Psych: ( - ) mood change, ( - ) new changes  All other systems were reviewed with the patient and are negative.  SUMMARY OF ONCOLOGIC HISTORY: Oncology History   No history exists.    I have reviewed the past medical history, past surgical history, social history and family history with the patient and they are unchanged from previous note.  ALLERGIES:  has No Known Allergies.  MEDICATIONS:  Current Outpatient Medications  Medication Sig Dispense Refill  . aspirin 81 MG tablet Take 81 mg by mouth daily.      [name] warfarin (COUMADIN) 5 MG tablet Take one and half tablets by mouth daily, except only one tablet on Friday. 126 tablet 2   No current facility-administered medications for this visit.     PHYSICAL EXAMINATION: ECOG PERFORMANCE STATUS: 0 - Asymptomatic  Today's Vitals   05/15/19 1328  BP: (!) 151/75  Pulse: 60  Resp: 17  Temp: (!) 97.1 F (36.2 C)  TempSrc: Temporal  SpO2: 99%  Weight: 213 lb 12.8 oz (97 kg)  PainSc: 0-No pain   Body mass index is 34.51 kg/m.  Filed Weights   05/15/19 1328  Weight: 213 lb 12.8 oz (97 kg)    GENERAL: alert, no distress and comfortable SKIN: skin color, texture, turgor are normal, no rashes or significant lesions EYES: conjunctiva are pink and non-injected, sclera clear OROPHARYNX: no exudate, no erythema; lips, buccal mucosa, and tongue normal  NECK: supple, non-tender LUNGS: clear to auscultation with normal breathing effort HEART: regular rate & rhythm and no murmurs and no lower extremity edema ABDOMEN: soft, non-tender, non-distended, normal bowel sounds Musculoskeletal: no cyanosis of digits and no clubbing  PSYCH: alert & oriented x 3, fluent speech  LABORATORY DATA:  I have reviewed the data as listed    Component Value Date/Time   NA 138 05/15/2019 1302   NA 136 12/15/2015 1021   NA 138 08/27/2014 0838   K 4.1 05/15/2019 1302   K 4.1 08/27/2014 0838   CL 104 05/15/2019 1302    CO2 26 05/15/2019 1302   CO2 22 08/27/2014 0838   GLUCOSE 95 05/15/2019 1302   GLUCOSE 104 08/27/2014 0838   BUN 12 05/15/2019 1302   BUN 10 12/15/2015 1021   BUN 10.5 08/27/2014 0838   CREATININE 0.81 05/15/2019 1302   CREATININE 0.74 04/12/2018 1012   CREATININE 0.7 08/27/2014 0838   CALCIUM 9.1 05/15/2019 1302   CALCIUM 8.6 08/27/2014 0838   PROT 7.0 05/15/2019 1302   PROT 6.0 12/15/2015 1021   PROT 6.4 08/27/2014 0838   ALBUMIN 4.2 05/15/2019 1302   ALBUMIN 3.8 12/15/2015 1021   ALBUMIN 3.5 08/27/2014 0838   AST 22 05/15/2019 1302   AST 15 08/27/2014 0838   ALT 16 05/15/2019 1302   ALT 13 08/27/2014 0838   ALKPHOS 35 (L) 05/15/2019 1302   ALKPHOS 34 (L) 08/27/2014 0838   BILITOT 0.4 05/15/2019 1302   BILITOT 0.21 08/27/2014 0838   GFRNONAA >60 05/15/2019 1302   GFRNONAA 94 04/12/2018  1012   GFRAA >60 05/15/2019 1302   GFRAA 109 04/12/2018 1012    No results found for: SPEP, UPEP  Lab Results  Component Value Date   WBC 6.1 05/15/2019   NEUTROABS 3.7 05/15/2019   HGB 13.4 05/15/2019   HCT 41.1 05/15/2019   MCV 90.1 05/15/2019   PLT 150 05/15/2019      Chemistry      Component Value Date/Time   NA 138 05/15/2019 1302   NA 136 12/15/2015 1021   NA 138 08/27/2014 0838   K 4.1 05/15/2019 1302   K 4.1 08/27/2014 0838   CL 104 05/15/2019 1302   CO2 26 05/15/2019 1302   CO2 22 08/27/2014 0838   BUN 12 05/15/2019 1302   BUN 10 12/15/2015 1021   BUN 10.5 08/27/2014 0838   CREATININE 0.81 05/15/2019 1302   CREATININE 0.74 04/12/2018 1012   CREATININE 0.7 08/27/2014 0838      Component Value Date/Time   CALCIUM 9.1 05/15/2019 1302   CALCIUM 8.6 08/27/2014 0838   ALKPHOS 35 (L) 05/15/2019 1302   ALKPHOS 34 (L) 08/27/2014 0838   AST 22 05/15/2019 1302   AST 15 08/27/2014 0838   ALT 16 05/15/2019 1302   ALT 13 08/27/2014 0838   BILITOT 0.4 05/15/2019 1302   BILITOT 0.21 08/27/2014 0838       RADIOGRAPHIC STUDIES: I have personally reviewed the  radiological images as listed below and agreed with the findings in the report. No results found.

## 2019-05-16 LAB — BETA-2-GLYCOPROTEIN I ABS, IGG/M/A
Beta-2 Glyco I IgG: <9 GPI IgG units (ref 0–20)
Beta-2-Glycoprotein I IgA: 23 GPI IgA units (ref 0–25)
Beta-2-Glycoprotein I IgM: <9 GPI IgM units (ref 0–32)

## 2019-05-17 LAB — CARDIOLIPIN ANTIBODIES, IGG, IGM, IGA
Anticardiolipin IgA: 10 APL U/mL (ref 0–11)
Anticardiolipin IgG: <9 GPL U/mL (ref 0–14)
Anticardiolipin IgM: 9 MPL U/mL (ref 0–12)

## 2019-05-18 ENCOUNTER — Ambulatory Visit: Payer: 59 | Admitting: Hematology

## 2019-05-18 ENCOUNTER — Other Ambulatory Visit: Payer: 59

## 2019-09-24 ENCOUNTER — Encounter: Payer: Self-pay | Admitting: Physician Assistant

## 2019-09-24 ENCOUNTER — Telehealth: Payer: Self-pay | Admitting: Physician Assistant

## 2019-09-24 ENCOUNTER — Telehealth (INDEPENDENT_AMBULATORY_CARE_PROVIDER_SITE_OTHER): Payer: 59 | Admitting: Physician Assistant

## 2019-09-24 VITALS — Ht 66.0 in | Wt 213.0 lb

## 2019-09-24 DIAGNOSIS — E6609 Other obesity due to excess calories: Secondary | ICD-10-CM | POA: Diagnosis not present

## 2019-09-24 DIAGNOSIS — Z6834 Body mass index (BMI) 34.0-34.9, adult: Secondary | ICD-10-CM | POA: Diagnosis not present

## 2019-09-24 DIAGNOSIS — L858 Other specified epidermal thickening: Secondary | ICD-10-CM | POA: Diagnosis not present

## 2019-09-24 MED ORDER — PHENTERMINE HCL 37.5 MG PO TABS: 37.5000 mg | ORAL_TABLET | Freq: Every day | ORAL | 0 refills | Status: DC

## 2019-09-24 NOTE — Progress Notes (Signed)
Rash Itches Started a month ago On right shin, left/right upper arms, stomach Comes and goes Changed to all natural detergent  Has tried to change lotion/soaps to see if helps Has tried gold bond but no other OTC meds  Also wants to discuss weight loss

## 2019-09-24 NOTE — Telephone Encounter (Signed)
Sent mychart msg.

## 2019-09-24 NOTE — Progress Notes (Signed)
Patient ID: JEZLYN WESTERFIELD, female   DOB: [birth year], 54 y.o.   MRN: [id number] .Marland KitchenVirtual Visit via Video Note  I connected with DERIKA ECKLES on 09/24/2019 at  9:10 AM EDT by a video enabled telemedicine application and verified that I am speaking with the correct person using two identifiers.  Location: Patient: home Provider: clinic   I discussed the limitations of evaluation and management by telemedicine and the availability of in person appointments. The patient expressed understanding and agreed to proceed.  History of Present Illness: Pt is a 54 yo female who calls into the clinic to discuss rash and weight loss.   Rash started about 1 month ago. Seems to be located on lateral arms, stomach, legs. Seems to come and go. It is itchy. She describes it as little pinpoint papules with some redness. She denies any new medication. No new products and has gone to all natural products recently. No food changes or association. Tried changing lotions and soaps no benefit. Tried gold bond but no benefit. No allergy symptoms.   She has been tryin to lose weight since last CPE. She has not been successful and would like to discuss medications. She is walking regularly.   .. Active Ambulatory Problems    Diagnosis Date Noted  . OTHER AND UNSPECIFIED COAGULATION DEFECTS 01/26/2010  . CONTACT DERMATITIS&OTHER ECZEMA DUE UNSPEC CAUSE 12/10/2009  . EDEMA 01/26/2010  . 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/18/2011  . DVT (deep venous thrombosis) (HCC) 05/18/2011  . Antiphospholipid syndrome (HCC) 06/18/2011  . History of pulmonary embolus (PE) 02/13/2012  . Abnormal mammogram of left breast 08/30/2014  . Elevated LDL cholesterol level 04/14/2018  . Class 1 obesity due to excess calories without serious comorbidity with body mass index (BMI) of 34.0 to 34.9 in adult 04/15/2018  . Keratosis pilaris 09/24/2019   Resolved Ambulatory Problems    Diagnosis Date Noted  . Routine physical  examination 08/30/2014   No Additional Past Medical History   Reviewed med, allergy, problem list.     Observations/Objective: No acute distress Obese.  Flesh like papules on a pink base on bilateral lateral arms, stomach and upper thigh.   .. Today's Vitals   09/24/19 0857  Weight: 213 lb (96.6 kg)  Height: 5\' 6"  (1.676 m)   Body mass index is 34.38 kg/m.      Assessment and Plan: Marland KitchenCamyah was seen today for rash.  Diagnoses and all orders for this visit:  Keratosis pilaris  Class 1 obesity due to excess calories without serious comorbidity with body mass index (BMI) of 34.0 to 34.9 in adult -     phentermine (ADIPEX-P) 37.5 MG tablet; Take 1 tablet (37.5 mg total) by mouth daily before breakfast.  Other orders -     Discontinue: phentermine (ADIPEX-P) 37.5 MG tablet; Take 1 tablet (37.5 mg total) by mouth daily before breakfast.   Rash seems consisent with keratosis pilaris. Sent HO via mychart and told to start lac hydrin and good moisturizer. If not improving in 2 weeks can send another prescription to try.   Darl Pikes.Discussed low carb diet with 1500 calories and 80g of protein.  Exercising at least 150 minutes a week.  My Fitness Pal could be a [name].  Discussed phentermine, saxenda, contrave. Opted for phentermine. Start with 1/2 tablet. Follow up in 2 months.    Follow Up Instructions:    I discussed the assessment and treatment plan with the patient. The patient was provided an opportunity to ask  questions and all were answered. The patient agreed with the plan and demonstrated an understanding of the instructions.   The patient was advised to call back or seek an in-person evaluation if the symptoms worsen or if the condition fails to improve as anticipated.  I provided 15 minutes of non-face-to-face time during this encounter.   [name], PA-C

## 2019-11-13 ENCOUNTER — Inpatient Hospital Stay (HOSPITAL_BASED_OUTPATIENT_CLINIC_OR_DEPARTMENT_OTHER): Payer: 59 | Admitting: Hematology

## 2019-11-13 ENCOUNTER — Other Ambulatory Visit: Payer: Self-pay

## 2019-11-13 ENCOUNTER — Encounter: Payer: Self-pay | Admitting: Hematology

## 2019-11-13 ENCOUNTER — Telehealth: Payer: Self-pay | Admitting: Hematology

## 2019-11-13 ENCOUNTER — Telehealth: Payer: Self-pay | Admitting: *Deleted

## 2019-11-13 ENCOUNTER — Inpatient Hospital Stay: Payer: 59 | Attending: Hematology

## 2019-11-13 VITALS — BP 124/60 | HR 63 | Temp 97.1°F | Resp 18 | Ht 66.0 in | Wt 200.0 lb

## 2019-11-13 DIAGNOSIS — I82402 Acute embolism and thrombosis of unspecified deep veins of left lower extremity: Secondary | ICD-10-CM

## 2019-11-13 DIAGNOSIS — M7989 Other specified soft tissue disorders: Secondary | ICD-10-CM | POA: Diagnosis not present

## 2019-11-13 DIAGNOSIS — D6861 Antiphospholipid syndrome: Secondary | ICD-10-CM | POA: Diagnosis not present

## 2019-11-13 DIAGNOSIS — Z86718 Personal history of other venous thrombosis and embolism: Secondary | ICD-10-CM | POA: Insufficient documentation

## 2019-11-13 DIAGNOSIS — Z7901 Long term (current) use of anticoagulants: Secondary | ICD-10-CM | POA: Diagnosis not present

## 2019-11-13 DIAGNOSIS — Z86711 Personal history of pulmonary embolism: Secondary | ICD-10-CM | POA: Diagnosis not present

## 2019-11-13 DIAGNOSIS — R7989 Other specified abnormal findings of blood chemistry: Secondary | ICD-10-CM | POA: Diagnosis not present

## 2019-11-13 LAB — CMP (CANCER CENTER ONLY)
ALT: 10 U/L (ref 0–44)
AST: 16 U/L (ref 15–41)
Albumin: 3.8 g/dL (ref 3.5–5.0)
Alkaline Phosphatase: 44 U/L (ref 38–126)
Anion gap: 6 (ref 5–15)
BUN: 11 mg/dL (ref 6–20)
CO2: 29 mmol/L (ref 22–32)
Calcium: 9.3 mg/dL (ref 8.9–10.3)
Chloride: 106 mmol/L (ref 98–111)
Creatinine: 0.88 mg/dL (ref 0.44–1.00)
GFR, Est AFR Am: >60 mL/min (ref >60)
GFR, Estimated: >60 mL/min (ref >60)
Glucose, Bld: 109 mg/dL — ABNORMAL HIGH (ref 70–99)
Potassium: 4.8 mmol/L (ref 3.5–5.1)
Sodium: 141 mmol/L (ref 135–145)
Total Bilirubin: 0.4 mg/dL (ref 0.3–1.2)
Total Protein: 6.7 g/dL (ref 6.5–8.1)

## 2019-11-13 LAB — CBC WITH DIFFERENTIAL (CANCER CENTER ONLY)
Abs Immature Granulocytes: 0.01 10*3/uL (ref 0.00–0.07)
Basophils Absolute: 0.1 10*3/uL (ref 0.0–0.1)
Basophils Relative: 1 %
Eosinophils Absolute: 0.2 10*3/uL (ref 0.0–0.5)
Eosinophils Relative: 5 %
HCT: 40.1 % (ref 36.0–46.0)
Hemoglobin: 13.1 g/dL (ref 12.0–15.0)
Immature Granulocytes: 0 %
Lymphocytes Relative: 31 %
Lymphs Abs: 1.4 10*3/uL (ref 0.7–4.0)
MCH: 28.9 pg (ref 26.0–34.0)
MCHC: 32.7 g/dL (ref 30.0–36.0)
MCV: 88.5 fL (ref 80.0–100.0)
Monocytes Absolute: 0.5 10*3/uL (ref 0.1–1.0)
Monocytes Relative: 10 %
Neutro Abs: 2.4 10*3/uL (ref 1.7–7.7)
Neutrophils Relative %: 53 %
Platelet Count: 342 10*3/uL (ref 150–400)
RBC: 4.53 MIL/uL (ref 3.87–5.11)
RDW: 14.7 % (ref 11.5–15.5)
WBC Count: 4.5 10*3/uL (ref 4.0–10.5)
nRBC: 0 % (ref 0.0–0.2)

## 2019-11-13 LAB — PROTIME-INR
INR: 3.9 — ABNORMAL HIGH (ref 0.8–1.2)
Prothrombin Time: 36.9 seconds — ABNORMAL HIGH (ref 11.4–15.2)

## 2019-11-13 NOTE — Telephone Encounter (Signed)
Per 5/4 los Return as needed

## 2019-11-13 NOTE — Progress Notes (Signed)
[facility] OFFICE PROGRESS NOTE  Patient Care Team: Nolene Ebbs as PCP - General (Family Medicine)  HEME/ONC OVERVIEW: 1. Antiphospholipid syndrome with hx of DVT and PTE -Previous patient of Dr. Cyndie Chime  -02/2001: LLE DVT and acute PTE, presumed provoked due to a long car ride and being on oral contraceptives -APLS work-up:  2007: elevated anticardiolipin IgA and IgM  2008: elevated beta-2-glycoprotein IgA  -07/2006: anticoagulation discontinued; started ppx ASA  -08/2007: extensive superficial thrombosis of the greater saphenous vein and calf veins; anticoagulation resumed (warfarin)  TREATMENT REGIMEN:  08/2007 - present: warfarin; goal INR 2-3, managed by PCP   ASSESSMENT & PLAN:   Antiphospholipid syndrome with hx of DVT and PTE -Currently on warfarin (since ~2009) without any abnormal bleeding or bruising  -INR 3.9 today; goal INR 2-3 -APLS panel relatively unremarkable in 05/2019 -If they are normal on a few visits, then that may suggest an idiopathic hypercoagulable syndrome rather than APLS -This would NOT necessarily affect her current regimen, as she will require lifelong anticoagulation and can remain on warfarin as long as she is tolerating it well, but in the event that she does have to make a change in her anticoagulation regimen (such as intolerance, fluctuating INR, etc), that may give her other options, such as Xarelto or Eliquis  -Continue compression stockings as needed to reduce lower extremity swelling  -Due to the elevated INR, I instructed the patient to hold warfarin, and contact her PCP/pharmacist for warfarin adjustment  -I reinforced the importance of preventive strategies such as avoiding hormonal supplement, avoiding cigarette smoking, keeping up-to-date with screening programs for early cancer detection, frequent ambulation for long distance travel and aggressive DVT prophylaxis in all surgical settings.  No orders of the  defined types were placed in this encounter.  The total time spent in the encounter was 30 minutes, including face-to-face time with the patient, review of various tests results, order additional studies/medications, documentation, and coordination of care plan.   All questions were answered. The patient knows to call the clinic with any problems, questions or concerns. No barriers to learning was detected.  Return as needed. Continue follow-up with PCP.   Arthur Holms, MD 5/4/20219:59 AM  CHIEF COMPLAINT: "I am doing fine"  INTERVAL HISTORY: Ms. Dearmas returns to clinic for follow-up of history of DVT and PTE in the setting of suspected antiphospholipid syndrome.  Patient reports that she has been doing well with warfarin, and denies any recurrent DVT, PTE, or abnormal bleeding/bruising.  She is up-to-date with her cancer screening, including Cologuard.  She denies any complaint today.  REVIEW OF SYSTEMS:   Constitutional: ( - ) fevers, ( - )  chills , ( - ) night sweats Eyes: ( - ) blurriness of vision, ( - ) double vision, ( - ) watery eyes Ears, nose, mouth, throat, and face: ( - ) mucositis, ( - ) sore throat Respiratory: ( - ) cough, ( - ) dyspnea, ( - ) wheezes Cardiovascular: ( - ) palpitation, ( - ) chest discomfort, ( - ) lower extremity swelling Gastrointestinal:  ( - ) nausea, ( - ) heartburn, ( - ) change in bowel habits Skin: ( - ) abnormal skin rashes Lymphatics: ( - ) new lymphadenopathy, ( - ) easy bruising Neurological: ( - ) numbness, ( - ) tingling, ( - ) new weaknesses Behavioral/Psych: ( - ) mood change, ( - ) new changes  All other systems were reviewed with the patient and  are negative.  SUMMARY OF ONCOLOGIC HISTORY: Oncology History   No history exists.    I have reviewed the past medical history, past surgical history, social history and family history with the patient and they are unchanged from previous note.  ALLERGIES:  has No Known  Allergies.  MEDICATIONS:  Current Outpatient Medications  Medication Sig Dispense Refill  . aspirin 81 MG tablet Take 81 mg by mouth daily.      . phentermine (ADIPEX-P) 37.5 MG tablet Take 1 tablet (37.5 mg total) by mouth daily before breakfast. 30 tablet 0  . warfarin (COUMADIN) 5 MG tablet Take one and half tablets by mouth daily, except only one tablet on Friday. (Patient taking differently: 1 tablet Monday, Wednesday, Friday; 1.5 tablets other days) 126 tablet 2   No current facility-administered medications for this visit.    PHYSICAL EXAMINATION: ECOG PERFORMANCE STATUS: 0 - Asymptomatic  Today's Vitals   11/13/19 0952  BP: 124/60  Pulse: 63  Resp: 18  Temp: (!) 97.1 F (36.2 C)  TempSrc: Temporal  SpO2: 100%  Weight: 200 lb (90.7 kg)  Height: 5\' 6"  (1.676 m)  PainSc: 0-No pain   Body mass index is 32.28 kg/m.  Filed Weights   11/13/19 0952  Weight: 200 lb (90.7 kg)    GENERAL: alert, no distress and comfortable SKIN: slight erythematous patch over the R anterior shin  EYES: conjunctiva are pink and non-injected, sclera clear OROPHARYNX: no exudate, no erythema; lips, buccal mucosa, and tongue normal  NECK: supple, non-tender LUNGS: clear to auscultation with normal breathing effort HEART: regular rate & rhythm and no murmurs and no lower extremity edema ABDOMEN: soft, non-tender, non-distended, normal bowel sounds Musculoskeletal: no cyanosis of digits and no clubbing  PSYCH: alert & oriented x 3, fluent speech  LABORATORY DATA:  I have reviewed the data as listed    Component Value Date/Time   NA 138 05/15/2019 1302   NA 136 12/15/2015 1021   NA 138 08/27/2014 0838   K 4.1 05/15/2019 1302   K 4.1 08/27/2014 0838   CL 104 05/15/2019 1302   CO2 26 05/15/2019 1302   CO2 22 08/27/2014 0838   GLUCOSE 95 05/15/2019 1302   GLUCOSE 104 08/27/2014 0838   BUN 12 05/15/2019 1302   BUN 10 12/15/2015 1021   BUN 10.5 08/27/2014 0838   CREATININE 0.81  05/15/2019 1302   CREATININE 0.74 04/12/2018 1012   CREATININE 0.7 08/27/2014 0838   CALCIUM 9.1 05/15/2019 1302   CALCIUM 8.6 08/27/2014 0838   PROT 7.0 05/15/2019 1302   PROT 6.0 12/15/2015 1021   PROT 6.4 08/27/2014 0838   ALBUMIN 4.2 05/15/2019 1302   ALBUMIN 3.8 12/15/2015 1021   ALBUMIN 3.5 08/27/2014 0838   AST 22 05/15/2019 1302   AST 15 08/27/2014 0838   ALT 16 05/15/2019 1302   ALT 13 08/27/2014 0838   ALKPHOS 35 (L) 05/15/2019 1302   ALKPHOS 34 (L) 08/27/2014 0838   BILITOT 0.4 05/15/2019 1302   BILITOT 0.21 08/27/2014 0838   GFRNONAA >60 05/15/2019 1302   GFRNONAA 94 04/12/2018 1012   GFRAA >60 05/15/2019 1302   GFRAA 109 04/12/2018 1012    No results found for: SPEP, UPEP  Lab Results  Component Value Date   WBC 4.5 11/13/2019   NEUTROABS 2.4 11/13/2019   HGB 13.1 11/13/2019   HCT 40.1 11/13/2019   MCV 88.5 11/13/2019   PLT 342 11/13/2019      Chemistry  Component Value Date/Time   NA 138 05/15/2019 1302   NA 136 12/15/2015 1021   NA 138 08/27/2014 0838   K 4.1 05/15/2019 1302   K 4.1 08/27/2014 0838   CL 104 05/15/2019 1302   CO2 26 05/15/2019 1302   CO2 22 08/27/2014 0838   BUN 12 05/15/2019 1302   BUN 10 12/15/2015 1021   BUN 10.5 08/27/2014 0838   CREATININE 0.81 05/15/2019 1302   CREATININE 0.74 04/12/2018 1012   CREATININE 0.7 08/27/2014 0838      Component Value Date/Time   CALCIUM 9.1 05/15/2019 1302   CALCIUM 8.6 08/27/2014 0838   ALKPHOS 35 (L) 05/15/2019 1302   ALKPHOS 34 (L) 08/27/2014 0838   AST 22 05/15/2019 1302   AST 15 08/27/2014 0838   ALT 16 05/15/2019 1302   ALT 13 08/27/2014 0838   BILITOT 0.4 05/15/2019 1302   BILITOT 0.21 08/27/2014 0838       RADIOGRAPHIC STUDIES: I have personally reviewed the radiological images as listed below and agreed with the findings in the report. No results found.

## 2019-11-13 NOTE — Telephone Encounter (Signed)
-----   Message from Arthur Holms, MD sent at 11/13/2019 10:17 AM EDT ----- Truddie Hidden,  Can you let Ms. Ding know that her INR is 3.9 today, so she should hold her warfarin today, and contact her PCP/pharamacist for warfarin adjustment?  Thanks.  GZ  ----- Message ----- From: Leory Plowman, [facility] [place] Sent: 11/13/2019   9:49 AM EDT To: Arthur Holms, MD

## 2019-11-13 NOTE — Telephone Encounter (Signed)
As noted below by Dr. Dion Body, I informed the patient that the INR today was 3.9. Per Dr. Dion Body, HOLD the Coumadin today and contact your PCP/pharmacist for Coumadin adjustment. She verbalized understanding. I will fax lab results to her PCP.

## 2019-12-04 ENCOUNTER — Other Ambulatory Visit: Payer: Self-pay | Admitting: Family

## 2019-12-04 ENCOUNTER — Other Ambulatory Visit: Payer: Self-pay | Admitting: Physician Assistant

## 2019-12-04 DIAGNOSIS — Z86711 Personal history of pulmonary embolism: Secondary | ICD-10-CM

## 2019-12-04 DIAGNOSIS — I82402 Acute embolism and thrombosis of unspecified deep veins of left lower extremity: Secondary | ICD-10-CM

## 2019-12-04 DIAGNOSIS — D6861 Antiphospholipid syndrome: Secondary | ICD-10-CM

## 2019-12-12 ENCOUNTER — Other Ambulatory Visit: Payer: Self-pay

## 2019-12-12 ENCOUNTER — Encounter: Payer: Self-pay | Admitting: Physician Assistant

## 2019-12-12 ENCOUNTER — Ambulatory Visit (INDEPENDENT_AMBULATORY_CARE_PROVIDER_SITE_OTHER): Payer: 59 | Admitting: Physician Assistant

## 2019-12-12 VITALS — BP 143/82 | HR 55 | Ht 66.0 in | Wt 194.0 lb

## 2019-12-12 DIAGNOSIS — Z1231 Encounter for screening mammogram for malignant neoplasm of breast: Secondary | ICD-10-CM | POA: Diagnosis not present

## 2019-12-12 DIAGNOSIS — Z131 Encounter for screening for diabetes mellitus: Secondary | ICD-10-CM | POA: Diagnosis not present

## 2019-12-12 DIAGNOSIS — E7800 Pure hypercholesterolemia, unspecified: Secondary | ICD-10-CM

## 2019-12-12 DIAGNOSIS — Z7901 Long term (current) use of anticoagulants: Secondary | ICD-10-CM | POA: Diagnosis not present

## 2019-12-12 DIAGNOSIS — R21 Rash and other nonspecific skin eruption: Secondary | ICD-10-CM

## 2019-12-12 DIAGNOSIS — Z13220 Encounter for screening for lipoid disorders: Secondary | ICD-10-CM | POA: Diagnosis not present

## 2019-12-12 LAB — CBC WITH DIFFERENTIAL/PLATELET
Absolute Monocytes: 468 cells/uL (ref 200–950)
Basophils Absolute: 52 cells/uL (ref 0–200)
Basophils Relative: 1 %
Eosinophils Absolute: 140 cells/uL (ref 15–500)
Eosinophils Relative: 2.7 %
HCT: 41.6 % (ref 35.0–45.0)
Hemoglobin: 14.1 g/dL (ref 11.7–15.5)
Lymphs Abs: 1487 cells/uL (ref 850–3900)
MCH: 29.9 pg (ref 27.0–33.0)
MCHC: 33.9 g/dL (ref 32.0–36.0)
MCV: 88.3 fL (ref 80.0–100.0)
MPV: 10.2 fL (ref 7.5–12.5)
Monocytes Relative: 9 %
Neutro Abs: 3052 cells/uL (ref 1500–7800)
Neutrophils Relative %: 58.7 %
Platelets: 398 10*3/uL (ref 140–400)
RBC: 4.71 10*6/uL (ref 3.80–5.10)
RDW: 14.5 % (ref 11.0–15.0)
Total Lymphocyte: 28.6 %
WBC: 5.2 10*3/uL (ref 3.8–10.8)

## 2019-12-12 LAB — COMPLETE METABOLIC PANEL WITH GFR
AG Ratio: 1.6 (calc) (ref 1.0–2.5)
ALT: 12 U/L (ref 6–29)
AST: 20 U/L (ref 10–35)
Albumin: 4.2 g/dL (ref 3.6–5.1)
Alkaline phosphatase (APISO): 46 U/L (ref 37–153)
BUN: 11 mg/dL (ref 7–25)
CO2: 27 mmol/L (ref 20–32)
Calcium: 9.2 mg/dL (ref 8.6–10.4)
Chloride: 104 mmol/L (ref 98–110)
Creat: 0.81 mg/dL (ref 0.50–1.05)
GFR, Est African American: 96 mL/min/{1.73_m2} (ref >=60)
GFR, Est Non African American: 83 mL/min/{1.73_m2} (ref >=60)
Globulin: 2.6 g/dL (calc) (ref 1.9–3.7)
Glucose, Bld: 98 mg/dL (ref 65–99)
Potassium: 4.8 mmol/L (ref 3.5–5.3)
Sodium: 138 mmol/L (ref 135–146)
Total Bilirubin: 0.5 mg/dL (ref 0.2–1.2)
Total Protein: 6.8 g/dL (ref 6.1–8.1)

## 2019-12-12 LAB — LIPID PANEL W/REFLEX DIRECT LDL
Cholesterol: 280 mg/dL — ABNORMAL HIGH (ref <200)
HDL: 68 mg/dL (ref >=50)
LDL Cholesterol (Calc): 195 mg/dL (calc) — ABNORMAL HIGH
Non-HDL Cholesterol (Calc): 212 mg/dL (calc) — ABNORMAL HIGH (ref <130)
Total CHOL/HDL Ratio: 4.1 (calc) (ref <5.0)
Triglycerides: 69 mg/dL (ref <150)

## 2019-12-12 LAB — PROTIME-INR
INR: 2.5 — ABNORMAL HIGH
Prothrombin Time: 25.9 s — ABNORMAL HIGH (ref 9.0–11.5)

## 2019-12-12 MED ORDER — CLOBETASOL PROPIONATE 0.05 % EX CREA: 1.0000 "application " | TOPICAL_CREAM | Freq: Two times a day (BID) | CUTANEOUS | 0 refills | Status: DC

## 2019-12-12 NOTE — Progress Notes (Signed)
This is perfect. Stay on same dose of coumadin. Recheck in 2-3 weeks to make sure stable.

## 2019-12-12 NOTE — Patient Instructions (Signed)

## 2019-12-12 NOTE — Progress Notes (Signed)
Subjective:    Patient ID: Sarah Harrington, female    DOB: 04/28/1966, 54 y.o.   MRN: [id number]  HPI  Pt is a 53 yo female with antiphospholipid antibody syndrome and hx of DVT who presents to the clinic for management of coumadin.   She has seen last week by hematology. There is some talk that her anticoagulation problem could be something else. She will return to clinic for more testing. INR was checked 5/4 and was 3.9. she has been fairly stable so that was not characteristic but her medication was recently changed from brand coumadin to warfarin. She held her dose and decrease to coumadin 1 tablet Monday/wednesday/friday/ Saturday and 1 and 1/2 tablets on Sunday/tuesday/thursday. No bleeding or abnormal bruising.   She does complain of itchy rash of lower right anterior shin. She has had for "months". Seems to be getting more red, hard and itchy. Denies any pain, fever, chills. Not done anything to make better.   .. Active Ambulatory Problems    Diagnosis Date Noted  . OTHER AND UNSPECIFIED COAGULATION DEFECTS 01/26/2010  . CONTACT DERMATITIS&OTHER ECZEMA DUE UNSPEC CAUSE 12/10/2009  . EDEMA 01/26/2010  . 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/18/2011  . DVT (deep venous thrombosis) (HCC) 05/18/2011  . Antiphospholipid syndrome (HCC) 06/18/2011  . History of pulmonary embolus (PE) 02/13/2012  . Abnormal mammogram of left breast 08/30/2014  . Elevated LDL cholesterol level 04/14/2018  . Class 1 obesity due to excess calories without serious comorbidity with body mass index (BMI) of 34.0 to 34.9 in adult 04/15/2018  . Keratosis pilaris 09/24/2019  . Skin rash 12/14/2019  . Chronic anticoagulation 12/14/2019   Resolved Ambulatory Problems    Diagnosis Date Noted  . Routine physical examination 08/30/2014   No Additional Past Medical History     Review of Systems See HPI.     Objective:   Physical Exam Vitals reviewed.  Constitutional:      Appearance: Normal  appearance.  Cardiovascular:     Rate and Rhythm: Normal rate and regular rhythm.     Pulses: Normal pulses.  Pulmonary:     Effort: Pulmonary effort is normal.     Breath sounds: Normal breath sounds.  Skin:    Comments: Right lower skin- area of induration and redness about 9cm by 4cm. No warmth or tenderness.  No calf tenderness.  Negative homans sign.  excoriations over rash.  Blanchable.  Neurological:     General: No focal deficit present.     Mental Status: She is alert and oriented to person, place, and time.  Psychiatric:        Mood and Affect: Mood normal.           Assessment & Plan:  Marland KitchenMarland KitchenRaevin was seen today for anticoagulation and rash.  Diagnoses and all orders for this visit:  Chronic anticoagulation -     Protime-INR -     CBC w/Diff/Platelet  Visit for screening mammogram -     MM 3D SCREEN BREAST BILATERAL  Screening for lipid disorders -     Lipid Panel w/reflex Direct LDL  Screening for diabetes mellitus -     COMPLETE METABOLIC PANEL WITH GFR  Skin rash -     CBC w/Diff/Platelet -     clobetasol cream (TEMOVATE) 0.05 %; Apply 1 application topically 2 (two) times daily.  Elevated LDL cholesterol level   POCT INR was not able to be read. Sent down to lab.   .. Results for orders  placed or performed in visit on 12/12/19  Protime-INR  Result Value Ref Range   INR 2.5 (H)    Prothrombin Time 25.9 (H) 9.0 - 11.5 sec  Lipid Panel w/reflex Direct LDL  Result Value Ref Range   Cholesterol 280 (H) <200 mg/dL   HDL 68 > OR = 50 mg/dL   Triglycerides 69 <150 mg/dL   LDL Cholesterol (Calc) 195 (H) mg/dL (calc)   Total CHOL/HDL Ratio 4.1 <5.0 (calc)   Non-HDL Cholesterol (Calc) 212 (H) <130 mg/dL (calc)  COMPLETE METABOLIC PANEL WITH GFR  Result Value Ref Range   Glucose, Bld 98 65 - 99 mg/dL   BUN 11 7 - 25 mg/dL   Creat 0.81 0.50 - 1.05 mg/dL   GFR, Est Non African American 83 > OR = 60 mL/min/1.109m2   GFR, Est African American 96 > OR  = 60 mL/min/1.99m2   BUN/Creatinine Ratio NOT APPLICABLE 6 - 22 (calc)   Sodium 138 135 - 146 mmol/L   Potassium 4.8 3.5 - 5.3 mmol/L   Chloride 104 98 - 110 mmol/L   CO2 27 20 - 32 mmol/L   Calcium 9.2 8.6 - 10.4 mg/dL   Total Protein 6.8 6.1 - 8.1 g/dL   Albumin 4.2 3.6 - 5.1 g/dL   Globulin 2.6 1.9 - 3.7 g/dL (calc)   AG Ratio 1.6 1.0 - 2.5 (calc)   Total Bilirubin 0.5 0.2 - 1.2 mg/dL   Alkaline phosphatase (APISO) 46 37 - 153 U/L   AST 20 10 - 35 U/L   ALT 12 6 - 29 U/L  CBC w/Diff/Platelet  Result Value Ref Range   WBC 5.2 3.8 - 10.8 Thousand/uL   RBC 4.71 3.80 - 5.10 Million/uL   Hemoglobin 14.1 11.7 - 15.5 g/dL   HCT 41.6 35.0 - 45.0 %   MCV 88.3 80.0 - 100.0 fL   MCH 29.9 27.0 - 33.0 pg   MCHC 33.9 32.0 - 36.0 g/dL   RDW 14.5 11.0 - 15.0 %   Platelets 398 140 - 400 Thousand/uL   MPV 10.2 7.5 - 12.5 fL   Neutro Abs 3,052 1,500 - 7,800 cells/uL   Lymphs Abs 1,487 850 - 3,900 cells/uL   Absolute Monocytes 468 200 - 950 cells/uL   Eosinophils Absolute 140 15 - 500 cells/uL   Basophils Absolute 52 0 - 200 cells/uL   Neutrophils Relative % 58.7 %   Total Lymphocyte 28.6 %   Monocytes Relative 9.0 %   Eosinophils Relative 2.7 %   Basophils Relative 1.0 %   In RANGE of 2-3. Stay on same dose. Recheck in 3 weeks.   Rash unclear etiology. Perhaps some dermatitis that has been persistent and then itching perhaps caused some induration in that area. Discussed compression, elevation, given temovate. Follow up in 10 days. No signs of DVT or infection. Will consider doxycycline if not improving or worsening. Discussed plan with Dr. Madilyn Fireman supervising physician and agreed with plan.   Fasting labs needed. Ordered today. Mammogram ordered.   Elevated LDL above 190. Discussed statin.

## 2019-12-14 ENCOUNTER — Other Ambulatory Visit: Payer: Self-pay | Admitting: Physician Assistant

## 2019-12-14 ENCOUNTER — Encounter: Payer: Self-pay | Admitting: Physician Assistant

## 2019-12-14 DIAGNOSIS — I82402 Acute embolism and thrombosis of unspecified deep veins of left lower extremity: Secondary | ICD-10-CM

## 2019-12-14 DIAGNOSIS — Z7901 Long term (current) use of anticoagulants: Secondary | ICD-10-CM | POA: Insufficient documentation

## 2019-12-14 DIAGNOSIS — D6861 Antiphospholipid syndrome: Secondary | ICD-10-CM

## 2019-12-14 DIAGNOSIS — R21 Rash and other nonspecific skin eruption: Secondary | ICD-10-CM | POA: Insufficient documentation

## 2019-12-14 DIAGNOSIS — Z86711 Personal history of pulmonary embolism: Secondary | ICD-10-CM

## 2019-12-14 MED ORDER — ATORVASTATIN CALCIUM 40 MG PO TABS
40.0000 mg | ORAL_TABLET | Freq: Every day | ORAL | 3 refills | Status: DC
Start: 2019-12-14 — End: 2021-01-06

## 2019-12-14 NOTE — Progress Notes (Signed)
Darl Pikes,   LDL is very elevated. That is your bad cholesterol. Your HDL is good.  Anything above 190 we automatically talk about cholesterol medications. What are your thoughts?   Kidney, liver, glucose look good.  No anemia.  WBC looks good to rule out any infection.

## 2019-12-17 MED ORDER — WARFARIN SODIUM 5 MG PO TABS: ORAL_TABLET | ORAL | 2 refills | Status: DC

## 2019-12-17 NOTE — Addendum Note (Signed)
Addended by: Jomarie Longs on: 12/17/2019 06:59 AM   Modules accepted: Orders

## 2020-09-02 ENCOUNTER — Other Ambulatory Visit: Payer: Self-pay | Admitting: Physician Assistant

## 2020-09-02 DIAGNOSIS — I82402 Acute embolism and thrombosis of unspecified deep veins of left lower extremity: Secondary | ICD-10-CM

## 2020-09-02 DIAGNOSIS — D6861 Antiphospholipid syndrome: Secondary | ICD-10-CM

## 2020-09-02 DIAGNOSIS — Z86711 Personal history of pulmonary embolism: Secondary | ICD-10-CM

## 2020-09-02 NOTE — Telephone Encounter (Signed)
Patient needs appt prior to any refills on medication, please call for visit.

## 2020-09-02 NOTE — Telephone Encounter (Signed)
LVM for patient to call back to get appt scheduled for any further refills. AM °

## 2020-09-02 NOTE — Telephone Encounter (Signed)
Was supposed to have INR 3 weeks from last visit in June. Please advise on this.

## 2020-09-10 ENCOUNTER — Other Ambulatory Visit: Payer: Self-pay | Admitting: Physician Assistant

## 2020-09-10 DIAGNOSIS — Z1231 Encounter for screening mammogram for malignant neoplasm of breast: Secondary | ICD-10-CM

## 2020-09-11 ENCOUNTER — Other Ambulatory Visit: Payer: Self-pay

## 2020-09-11 ENCOUNTER — Ambulatory Visit (INDEPENDENT_AMBULATORY_CARE_PROVIDER_SITE_OTHER): Payer: 59

## 2020-09-11 DIAGNOSIS — Z1231 Encounter for screening mammogram for malignant neoplasm of breast: Secondary | ICD-10-CM

## 2020-09-15 NOTE — Progress Notes (Signed)
[name],   Normal mammogram. Follow up in 1 year.

## 2020-09-17 ENCOUNTER — Ambulatory Visit (INDEPENDENT_AMBULATORY_CARE_PROVIDER_SITE_OTHER): Payer: 59 | Admitting: Physician Assistant

## 2020-09-17 ENCOUNTER — Other Ambulatory Visit: Payer: Self-pay

## 2020-09-17 VITALS — BP 118/76 | HR 63 | Ht 66.0 in | Wt 176.0 lb

## 2020-09-17 DIAGNOSIS — E6609 Other obesity due to excess calories: Secondary | ICD-10-CM

## 2020-09-17 DIAGNOSIS — E7800 Pure hypercholesterolemia, unspecified: Secondary | ICD-10-CM

## 2020-09-17 DIAGNOSIS — Z7901 Long term (current) use of anticoagulants: Secondary | ICD-10-CM | POA: Diagnosis not present

## 2020-09-17 DIAGNOSIS — Z13220 Encounter for screening for lipoid disorders: Secondary | ICD-10-CM | POA: Diagnosis not present

## 2020-09-17 DIAGNOSIS — Z131 Encounter for screening for diabetes mellitus: Secondary | ICD-10-CM

## 2020-09-17 DIAGNOSIS — Z1159 Encounter for screening for other viral diseases: Secondary | ICD-10-CM

## 2020-09-17 DIAGNOSIS — Z23 Encounter for immunization: Secondary | ICD-10-CM

## 2020-09-17 DIAGNOSIS — D6861 Antiphospholipid syndrome: Secondary | ICD-10-CM

## 2020-09-17 DIAGNOSIS — Z0000 Encounter for general adult medical examination without abnormal findings: Secondary | ICD-10-CM | POA: Diagnosis not present

## 2020-09-17 DIAGNOSIS — Z1329 Encounter for screening for other suspected endocrine disorder: Secondary | ICD-10-CM

## 2020-09-17 NOTE — Addendum Note (Signed)
Addended bySilvio Pate on: 09/17/2020 09:31 AM   Modules accepted: Orders

## 2020-09-17 NOTE — Progress Notes (Signed)
INR is 1.4 and low for you. Have you missed any doses? I want to confirm exactly how you are taking before we increase.

## 2020-09-17 NOTE — Progress Notes (Signed)
Subjective:     Sarah Harrington is a 55 y.o. female and is here for a comprehensive physical exam. The patient reports no problems.  Social History   Socioeconomic History  . Marital status: Married    Spouse name: Not on file  . Number of children: Not on file  . Years of education: Not on file  . Highest education level: Not on file  Occupational History  . Not on file  Tobacco Use  . Smoking status: Never Smoker  . Smokeless tobacco: Never Used  Vaping Use  . Vaping Use: Never used  Substance and Sexual Activity  . Alcohol use: Yes    Alcohol/week: 0.0 standard drinks  . Drug use: No  . Sexual activity: Not on file  Other Topics Concern  . Not on file  Social History Narrative  . Not on file   Social Determinants of Health   Financial Resource Strain: Not on file  Food Insecurity: Not on file  Transportation Needs: Not on file  Physical Activity: Not on file  Stress: Not on file  Social Connections: Not on file  Intimate Partner Violence: Not on file   Health Maintenance  Topic Date Due  . Hepatitis C Screening  Never done  . HIV Screening  12/11/2020 (Originally [birth year]/1981)  . Fecal DNA (Cologuard)  05/08/2021  . MAMMOGRAM  09/11/2021  . PAP SMEAR-Modifier  05/03/2023  . TETANUS/TDAP  04/12/2028  . INFLUENZA VACCINE  Completed  . COVID-19 Vaccine  Completed  . HPV VACCINES  Aged Out    The following portions of the patient's history were reviewed and updated as appropriate: allergies, current medications, past family history, past medical history, past social history, past surgical history and problem list.  Review of Systems A comprehensive review of systems was negative.   Objective:    BP 118/76   Pulse 63   Ht 5\' 6"  (1.676 m)   Wt 176 lb (79.8 kg)   SpO2 99%   BMI 28.41 kg/m  General appearance: alert, cooperative and appears stated age Head: Normocephalic, without obvious abnormality, atraumatic Eyes: conjunctivae/corneas clear. PERRL,  EOM's intact. Fundi benign. Ears: normal TM's and external ear canals both ears Nose: Nares normal. Septum midline. Mucosa normal. No drainage or sinus tenderness. Throat: lips, mucosa, and tongue normal; teeth and gums normal Neck: no adenopathy, no carotid bruit, no JVD, supple, symmetrical, trachea midline and thyroid not enlarged, symmetric, no tenderness/mass/nodules Back: symmetric, no curvature. ROM normal. No CVA tenderness. Lungs: clear to auscultation bilaterally Heart: regular rate and rhythm, S1, S2 normal, no murmur, click, rub or gallop Abdomen: soft, non-tender; bowel sounds normal; no masses,  no organomegaly Extremities: extremities normal, atraumatic, no cyanosis or edema and varicose veins noted Pulses: 2+ and symmetric Skin: Skin color, texture, turgor normal. No rashes or lesions Lymph nodes: Cervical, supraclavicular, and axillary nodes normal. Neurologic: Alert and oriented X 3, normal strength and tone. Normal symmetric reflexes. Normal coordination and gait    Assessment:    Healthy female exam.      Plan:      Sarah Harrington was seen today for annual exam.  Diagnoses and all orders for this visit:  Routine physical examination -     COMPLETE METABOLIC PANEL WITH GFR -     CBC with Differential/Platelet -     Lipid Panel w/reflex Direct LDL -     TSH  Chronic anticoagulation -     CBC with Differential/Platelet -     INR/PT  Screening for diabetes mellitus -     COMPLETE METABOLIC PANEL WITH GFR  Screening for lipid disorders -     Lipid Panel w/reflex Direct LDL  Elevated LDL cholesterol level -     Lipid Panel w/reflex Direct LDL  Thyroid disorder screen -     TSH  Antiphospholipid antibody syndrome (HCC)  Encounter for hepatitis C screening test for low risk patient -     Hepatitis C Antibody   .Sarah Kitchen Discussed 150 minutes of exercise a week.  Encouraged vitamin D 1000 units and Calcium 1300mg  or 4 servings of dairy a day.  Fasting labs  ordered.  Pap UTD. Mammogram UTD.  Colonoscopy UTD.  Hep C ordered.  First shingrix given today follow up in 2-6 months for 2nd shot.  Covid/flu vaccine UTD.    See After Visit Summary for Counseling Recommendations

## 2020-09-17 NOTE — Patient Instructions (Signed)
Health Maintenance, Female Adopting a healthy lifestyle and getting preventive care are important in promoting health and wellness. Ask your health care provider about:  The right schedule for you to have regular tests and exams.  Things you can do on your own to prevent diseases and keep yourself healthy. What should I know about diet, weight, and exercise? Eat a healthy diet  Eat a diet that includes plenty of vegetables, fruits, low-fat dairy products, and lean protein.  Do not eat a lot of foods that are high in solid fats, added sugars, or sodium.   Maintain a healthy weight Body mass index (BMI) is used to identify weight problems. It estimates body fat based on height and weight. Your health care provider can help determine your BMI and help you achieve or maintain a healthy weight. Get regular exercise Get regular exercise. This is one of the most important things you can do for your health. Most adults should:  Exercise for at least 150 minutes each week. The exercise should increase your heart rate and make you sweat (moderate-intensity exercise).  Do strengthening exercises at least twice a week. This is in addition to the moderate-intensity exercise.  Spend less time sitting. Even light physical activity can be beneficial. Watch cholesterol and blood lipids Have your blood tested for lipids and cholesterol at 54 years of age, then have this test every 5 years. Have your cholesterol levels checked more often if:  Your lipid or cholesterol levels are high.  You are older than 54 years of age.  You are at high risk for heart disease. What should I know about cancer screening? Depending on your health history and family history, you may need to have cancer screening at various ages. This may include screening for:  Breast cancer.  Cervical cancer.  Colorectal cancer.  Skin cancer.  Lung cancer. What should I know about heart disease, diabetes, and high blood  pressure? Blood pressure and heart disease  High blood pressure causes heart disease and increases the risk of stroke. This is more likely to develop in people who have high blood pressure readings, are of African descent, or are overweight.  Have your blood pressure checked: ? Every 3-5 years if you are 18-39 years of age. ? Every year if you are 40 years old or older. Diabetes Have regular diabetes screenings. This checks your fasting blood sugar level. Have the screening done:  Once every three years after age 40 if you are at a normal weight and have a low risk for diabetes.  More often and at a younger age if you are overweight or have a high risk for diabetes. What should I know about preventing infection? Hepatitis B If you have a higher risk for hepatitis B, you should be screened for this virus. Talk with your health care provider to find out if you are at risk for hepatitis B infection. Hepatitis C Testing is recommended for:  Everyone born from 1945 through 1965.  Anyone with known risk factors for hepatitis C. Sexually transmitted infections (STIs)  Get screened for STIs, including gonorrhea and chlamydia, if: ? You are sexually active and are younger than 54 years of age. ? You are older than 54 years of age and your health care provider tells you that you are at risk for this type of infection. ? Your sexual activity has changed since you were last screened, and you are at increased risk for chlamydia or gonorrhea. Ask your health care provider   if you are at risk.  Ask your health care provider about whether you are at high risk for HIV. Your health care provider may recommend a prescription medicine to help prevent HIV infection. If you choose to take medicine to prevent HIV, you should first get tested for HIV. You should then be tested every 3 months for as long as you are taking the medicine. Pregnancy  If you are about to stop having your period (premenopausal) and  you may become pregnant, seek counseling before you get pregnant.  Take 400 to 800 micrograms (mcg) of folic acid every day if you become pregnant.  Ask for birth control (contraception) if you want to prevent pregnancy. Osteoporosis and menopause Osteoporosis is a disease in which the bones lose minerals and strength with aging. This can result in bone fractures. If you are 65 years old or older, or if you are at risk for osteoporosis and fractures, ask your health care provider if you should:  Be screened for bone loss.  Take a calcium or vitamin D supplement to lower your risk of fractures.  Be given hormone replacement therapy (HRT) to treat symptoms of menopause. Follow these instructions at home: Lifestyle  Do not use any products that contain nicotine or tobacco, such as cigarettes, e-cigarettes, and chewing tobacco. If you need help quitting, ask your health care provider.  Do not use street drugs.  Do not share needles.  Ask your health care provider for help if you need support or information about quitting drugs. Alcohol use  Do not drink alcohol if: ? Your health care provider tells you not to drink. ? You are pregnant, may be pregnant, or are planning to become pregnant.  If you drink alcohol: ? Limit how much you use to 0-1 drink a day. ? Limit intake if you are breastfeeding.  Be aware of how much alcohol is in your drink. In the [place] one drink equals one 12 oz bottle of beer (355 mL), one 5 oz glass of wine (148 mL), or one 1 oz glass of hard liquor (44 mL). General instructions  Schedule regular health, dental, and eye exams.  Stay current with your vaccines.  Tell your health care provider if: ? You often feel depressed. ? You have ever been abused or do not feel safe at home. Summary  Adopting a healthy lifestyle and getting preventive care are important in promoting health and wellness.  Follow your health care provider's instructions about healthy  diet, exercising, and getting tested or screened for diseases.  Follow your health care provider's instructions on monitoring your cholesterol and blood pressure. This information is not intended to replace advice given to you by your health care provider. Make sure you discuss any questions you have with your health care provider. Document Revised: 06/21/2018 Document Reviewed: 06/21/2018 Elsevier Patient Education  2021 [employer]  

## 2020-09-18 ENCOUNTER — Encounter: Payer: Self-pay | Admitting: Physician Assistant

## 2020-09-18 ENCOUNTER — Other Ambulatory Visit: Payer: Self-pay | Admitting: *Deleted

## 2020-09-18 DIAGNOSIS — R748 Abnormal levels of other serum enzymes: Secondary | ICD-10-CM

## 2020-09-18 DIAGNOSIS — Z86711 Personal history of pulmonary embolism: Secondary | ICD-10-CM

## 2020-09-18 DIAGNOSIS — D6861 Antiphospholipid syndrome: Secondary | ICD-10-CM

## 2020-09-18 DIAGNOSIS — I82402 Acute embolism and thrombosis of unspecified deep veins of left lower extremity: Secondary | ICD-10-CM

## 2020-09-18 DIAGNOSIS — Z7901 Long term (current) use of anticoagulants: Secondary | ICD-10-CM

## 2020-09-18 LAB — COMPLETE METABOLIC PANEL WITH GFR
AG Ratio: 1.5 (calc) (ref 1.0–2.5)
ALT: 50 U/L — ABNORMAL HIGH (ref 6–29)
AST: 52 U/L — ABNORMAL HIGH (ref 10–35)
Albumin: 4.1 g/dL (ref 3.6–5.1)
Alkaline phosphatase (APISO): 66 U/L (ref 37–153)
BUN: 13 mg/dL (ref 7–25)
CO2: 24 mmol/L (ref 20–32)
Calcium: 9 mg/dL (ref 8.6–10.4)
Chloride: 107 mmol/L (ref 98–110)
Creat: 0.81 mg/dL (ref 0.50–1.05)
GFR, Est African American: 96 mL/min/{1.73_m2} (ref >=60)
GFR, Est Non African American: 83 mL/min/{1.73_m2} (ref >=60)
Globulin: 2.8 g/dL (calc) (ref 1.9–3.7)
Glucose, Bld: 99 mg/dL (ref 65–99)
Potassium: 4.6 mmol/L (ref 3.5–5.3)
Sodium: 140 mmol/L (ref 135–146)
Total Bilirubin: 0.3 mg/dL (ref 0.2–1.2)
Total Protein: 6.9 g/dL (ref 6.1–8.1)

## 2020-09-18 LAB — CBC WITH DIFFERENTIAL/PLATELET
Absolute Monocytes: 384 cells/uL (ref 200–950)
Basophils Absolute: 72 cells/uL (ref 0–200)
Basophils Relative: 1.5 %
Eosinophils Absolute: 163 cells/uL (ref 15–500)
Eosinophils Relative: 3.4 %
HCT: 39.8 % (ref 35.0–45.0)
Hemoglobin: 12.9 g/dL (ref 11.7–15.5)
Lymphs Abs: 1109 cells/uL (ref 850–3900)
MCH: 28.2 pg (ref 27.0–33.0)
MCHC: 32.4 g/dL (ref 32.0–36.0)
MCV: 86.9 fL (ref 80.0–100.0)
MPV: 10.7 fL (ref 7.5–12.5)
Monocytes Relative: 8 %
Neutro Abs: 3072 cells/uL (ref 1500–7800)
Neutrophils Relative %: 64 %
Platelets: 349 10*3/uL (ref 140–400)
RBC: 4.58 10*6/uL (ref 3.80–5.10)
RDW: 14.8 % (ref 11.0–15.0)
Total Lymphocyte: 23.1 %
WBC: 4.8 10*3/uL (ref 3.8–10.8)

## 2020-09-18 LAB — LIPID PANEL W/REFLEX DIRECT LDL
Cholesterol: 169 mg/dL (ref <200)
HDL: 69 mg/dL (ref >=50)
LDL Cholesterol (Calc): 86 mg/dL (calc)
Non-HDL Cholesterol (Calc): 100 mg/dL (calc) (ref <130)
Total CHOL/HDL Ratio: 2.4 (calc) (ref <5.0)
Triglycerides: 53 mg/dL (ref <150)

## 2020-09-18 LAB — TSH: TSH: 1.81 mIU/L

## 2020-09-18 LAB — HEPATITIS C ANTIBODY
Hepatitis C Ab: NONREACTIVE
SIGNAL TO CUT-OFF: 0.04 (ref <1.00)

## 2020-09-18 LAB — PROTIME-INR
INR: 1.4 — ABNORMAL HIGH
Prothrombin Time: 14.4 s — ABNORMAL HIGH (ref 9.0–11.5)

## 2020-09-18 MED ORDER — WARFARIN SODIUM 5 MG PO TABS: ORAL_TABLET | ORAL | 2 refills | Status: DC

## 2020-09-19 ENCOUNTER — Encounter: Payer: Self-pay | Admitting: Physician Assistant

## 2020-09-19 DIAGNOSIS — R748 Abnormal levels of other serum enzymes: Secondary | ICD-10-CM | POA: Insufficient documentation

## 2020-09-19 NOTE — Progress Notes (Signed)
Xoe,   Cholesterol is amazing.  Hemoglobin looks great.  Kidney function and glucose wonderful.  Your liver enzymes are up from last check. Are you taking more tylenol or drinking more alcohol? Will recheck liver enzymes with INR in 4 weeks. Avoid the above.

## 2020-09-20 ENCOUNTER — Encounter: Payer: Self-pay | Admitting: Physician Assistant

## 2020-10-20 ENCOUNTER — Other Ambulatory Visit: Payer: Self-pay

## 2020-10-20 ENCOUNTER — Ambulatory Visit: Payer: 59

## 2020-10-21 LAB — HEPATIC FUNCTION PANEL
AG Ratio: 1.4 (calc) (ref 1.0–2.5)
ALT: 42 U/L — ABNORMAL HIGH (ref 6–29)
AST: 41 U/L — ABNORMAL HIGH (ref 10–35)
Albumin: 3.9 g/dL (ref 3.6–5.1)
Alkaline phosphatase (APISO): 62 U/L (ref 37–153)
Bilirubin, Direct: 0.1 mg/dL (ref 0.0–0.2)
Globulin: 2.7 g/dL (calc) (ref 1.9–3.7)
Indirect Bilirubin: 0.3 mg/dL (calc) (ref 0.2–1.2)
Total Bilirubin: 0.4 mg/dL (ref 0.2–1.2)
Total Protein: 6.6 g/dL (ref 6.1–8.1)

## 2020-10-21 LAB — PROTIME-INR
INR: 3 — ABNORMAL HIGH
Prothrombin Time: 28.5 s — ABNORMAL HIGH (ref 9.0–11.5)

## 2020-10-21 NOTE — Progress Notes (Signed)
Still remains elevated. Need to get ultrasound of liver, hepatitis panel, ferritin, serum iron to look at a little further. Likely elevated enzymes due to some fat accumulation around the liver.   INR back to 3. Do you need any refills.

## 2020-10-22 ENCOUNTER — Other Ambulatory Visit: Payer: Self-pay | Admitting: Neurology

## 2020-10-22 DIAGNOSIS — R748 Abnormal levels of other serum enzymes: Secondary | ICD-10-CM

## 2020-10-22 NOTE — Progress Notes (Signed)
Orders signed.

## 2020-10-22 NOTE — Progress Notes (Signed)
Not sure if this is the correct imaging order, please review and sign if correct.

## 2021-01-03 ENCOUNTER — Other Ambulatory Visit: Payer: Self-pay | Admitting: Physician Assistant

## 2021-02-25 ENCOUNTER — Ambulatory Visit: Payer: 59 | Admitting: Physician Assistant

## 2021-07-25 ENCOUNTER — Other Ambulatory Visit: Payer: Self-pay | Admitting: Physician Assistant

## 2021-07-25 DIAGNOSIS — Z7901 Long term (current) use of anticoagulants: Secondary | ICD-10-CM

## 2021-07-25 DIAGNOSIS — Z86711 Personal history of pulmonary embolism: Secondary | ICD-10-CM

## 2021-07-25 DIAGNOSIS — I82402 Acute embolism and thrombosis of unspecified deep veins of left lower extremity: Secondary | ICD-10-CM

## 2021-07-25 DIAGNOSIS — D6861 Antiphospholipid syndrome: Secondary | ICD-10-CM

## 2021-07-27 NOTE — Telephone Encounter (Signed)
Needs recheck PT/INR can do it in lab if needed.

## 2021-07-27 NOTE — Telephone Encounter (Signed)
Left message on machine for patient to call back.

## 2021-08-25 ENCOUNTER — Encounter: Payer: Self-pay | Admitting: Physician Assistant

## 2021-08-25 DIAGNOSIS — Z7901 Long term (current) use of anticoagulants: Secondary | ICD-10-CM

## 2021-08-25 DIAGNOSIS — D6861 Antiphospholipid syndrome: Secondary | ICD-10-CM

## 2021-08-25 DIAGNOSIS — Z86711 Personal history of pulmonary embolism: Secondary | ICD-10-CM

## 2021-08-25 DIAGNOSIS — I82402 Acute embolism and thrombosis of unspecified deep veins of left lower extremity: Secondary | ICD-10-CM

## 2021-08-26 ENCOUNTER — Other Ambulatory Visit: Payer: Self-pay | Admitting: Physician Assistant

## 2021-08-26 DIAGNOSIS — D6861 Antiphospholipid syndrome: Secondary | ICD-10-CM

## 2021-08-26 DIAGNOSIS — I82402 Acute embolism and thrombosis of unspecified deep veins of left lower extremity: Secondary | ICD-10-CM

## 2021-08-26 DIAGNOSIS — Z86711 Personal history of pulmonary embolism: Secondary | ICD-10-CM

## 2021-08-26 LAB — PROTIME-INR
INR: 1.2 — ABNORMAL HIGH
Prothrombin Time: 11.9 s — ABNORMAL HIGH (ref 9.0–11.5)

## 2021-08-26 MED ORDER — WARFARIN SODIUM 5 MG PO TABS: ORAL_TABLET | ORAL | 0 refills | Status: DC

## 2021-08-26 MED ORDER — WARFARIN SODIUM 5 MG PO TABS: ORAL_TABLET | ORAL | 2 refills | Status: DC

## 2021-08-26 NOTE — Progress Notes (Signed)
Sent warfarin please recheck in 1-2 weeks labs so that any adjustments may be made.

## 2021-08-26 NOTE — Progress Notes (Signed)
You are not in INR range of 2-3 confirm how you are taking and if you have missed any doses?

## 2021-09-11 ENCOUNTER — Encounter: Payer: Self-pay | Admitting: Physician Assistant

## 2021-10-13 LAB — COLOGUARD: COLOGUARD: NEGATIVE

## 2021-10-14 NOTE — Progress Notes (Signed)
Normal cologuard. Follow up in 3 years.

## 2021-10-19 ENCOUNTER — Other Ambulatory Visit: Payer: Self-pay | Admitting: Physician Assistant

## 2021-10-19 DIAGNOSIS — R748 Abnormal levels of other serum enzymes: Secondary | ICD-10-CM

## 2021-10-23 ENCOUNTER — Encounter: Payer: Self-pay | Admitting: Physician Assistant

## 2021-10-24 LAB — PROTIME-INR
INR: 2.5 — ABNORMAL HIGH
Prothrombin Time: 23.6 s — ABNORMAL HIGH (ref 9.0–11.5)

## 2021-10-26 NOTE — Progress Notes (Signed)
INR range 2-3 and in range. Do you need refills of warfarin? Need to recheck stability in 1 month lab only.

## 2021-11-04 ENCOUNTER — Other Ambulatory Visit: Payer: Self-pay | Admitting: Physician Assistant

## 2021-11-04 DIAGNOSIS — Z86711 Personal history of pulmonary embolism: Secondary | ICD-10-CM

## 2021-11-04 DIAGNOSIS — D6861 Antiphospholipid syndrome: Secondary | ICD-10-CM

## 2021-11-04 DIAGNOSIS — I82402 Acute embolism and thrombosis of unspecified deep veins of left lower extremity: Secondary | ICD-10-CM

## 2021-11-04 MED ORDER — WARFARIN SODIUM 5 MG PO TABS: ORAL_TABLET | ORAL | 1 refills | Status: DC

## 2021-12-15 ENCOUNTER — Other Ambulatory Visit (HOSPITAL_BASED_OUTPATIENT_CLINIC_OR_DEPARTMENT_OTHER): Payer: Self-pay | Admitting: Physician Assistant

## 2021-12-15 DIAGNOSIS — Z1231 Encounter for screening mammogram for malignant neoplasm of breast: Secondary | ICD-10-CM

## 2021-12-17 ENCOUNTER — Ambulatory Visit (INDEPENDENT_AMBULATORY_CARE_PROVIDER_SITE_OTHER): Payer: 59

## 2021-12-17 DIAGNOSIS — Z1231 Encounter for screening mammogram for malignant neoplasm of breast: Secondary | ICD-10-CM

## 2021-12-18 NOTE — Progress Notes (Signed)
Normal mammogram. Follow up in 1 year.

## 2022-01-07 ENCOUNTER — Other Ambulatory Visit: Payer: Self-pay | Admitting: Physician Assistant

## 2022-03-16 ENCOUNTER — Encounter: Payer: Self-pay | Admitting: Physician Assistant

## 2022-03-16 ENCOUNTER — Ambulatory Visit (INDEPENDENT_AMBULATORY_CARE_PROVIDER_SITE_OTHER): Payer: 59 | Admitting: Physician Assistant

## 2022-03-16 VITALS — BP 135/73 | HR 56 | Wt 192.8 lb

## 2022-03-16 DIAGNOSIS — Z1329 Encounter for screening for other suspected endocrine disorder: Secondary | ICD-10-CM | POA: Diagnosis not present

## 2022-03-16 DIAGNOSIS — Z7901 Long term (current) use of anticoagulants: Secondary | ICD-10-CM | POA: Diagnosis not present

## 2022-03-16 DIAGNOSIS — Z0000 Encounter for general adult medical examination without abnormal findings: Secondary | ICD-10-CM

## 2022-03-16 DIAGNOSIS — Z23 Encounter for immunization: Secondary | ICD-10-CM

## 2022-03-16 DIAGNOSIS — Z131 Encounter for screening for diabetes mellitus: Secondary | ICD-10-CM

## 2022-03-16 DIAGNOSIS — E7800 Pure hypercholesterolemia, unspecified: Secondary | ICD-10-CM

## 2022-03-16 DIAGNOSIS — Z86711 Personal history of pulmonary embolism: Secondary | ICD-10-CM

## 2022-03-16 DIAGNOSIS — I82402 Acute embolism and thrombosis of unspecified deep veins of left lower extremity: Secondary | ICD-10-CM

## 2022-03-16 DIAGNOSIS — D6861 Antiphospholipid syndrome: Secondary | ICD-10-CM

## 2022-03-16 MED ORDER — ATORVASTATIN CALCIUM 40 MG PO TABS: 40.0000 mg | ORAL_TABLET | Freq: Every day | ORAL | 3 refills | Status: DC

## 2022-03-16 MED ORDER — WARFARIN SODIUM 5 MG PO TABS: ORAL_TABLET | ORAL | 3 refills | Status: DC

## 2022-03-16 NOTE — Patient Instructions (Signed)

## 2022-03-16 NOTE — Progress Notes (Signed)
Complete physical exam  Patient: Sarah Harrington   DOB: August 09, 1965   56 y.o. Female  MRN: [id number]  Subjective:    Chief Complaint  Patient presents with   Annual Exam    Sarah Harrington is a 56 y.o. female who presents today for a complete physical exam. She reports consuming a general diet.  Pt walks her dogs daily about 1 mile.   She generally feels well. She reports sleeping well. She does not have additional problems to discuss today.    Most recent fall risk assessment:    03/16/2022    8:51 AM  Fall Risk   Falls in the past year? 0  Number falls in past yr: 0  Injury with Fall? 0  Risk for fall due to : No Fall Risks  Follow up Falls evaluation completed     Most recent depression screenings:    03/16/2022    8:51 AM 09/17/2020    8:09 AM  PHQ 2/9 Scores  PHQ - 2 Score 0 0  PHQ- 9 Score 3     Vision:Within last year and Dental: No current dental problems  Patient Active Problem List   Diagnosis Date Noted   Elevated liver enzymes 09/19/2020   Skin rash 12/14/2019   Chronic anticoagulation 12/14/2019   Keratosis pilaris 09/24/2019   Class 1 obesity due to excess calories without serious comorbidity with body mass index (BMI) of 34.0 to 34.9 in adult 04/15/2018   Elevated LDL cholesterol level 04/14/2018   Abnormal mammogram of left breast 08/30/2014   History of pulmonary embolus (PE) 02/13/2012   Antiphospholipid syndrome (HCC) 06/18/2011   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/18/2011   DVT (deep venous thrombosis) (HCC) 05/18/2011   OTHER AND UNSPECIFIED COAGULATION DEFECTS 01/26/2010   EDEMA 01/26/2010   CONTACT DERMATITIS&OTHER ECZEMA DUE UNSPEC CAUSE 12/10/2009   Past Medical History:  Diagnosis Date   History of pulmonary embolus (PE) 02/13/2012   Family History  Problem Relation Age of Onset   Hyperlipidemia Father    Cancer Maternal Grandfather        prostate   Cancer Paternal Grandmother        breast   Heart attack Paternal  Grandfather    No Known Allergies    Patient Care Team: Nolene Ebbs as PCP - General (Family Medicine)   Outpatient Medications Prior to Visit  Medication Sig   aspirin 81 MG tablet Take 81 mg by mouth daily.   [DISCONTINUED] atorvastatin (LIPITOR) 40 MG tablet Take 1 tablet (40 mg total) by mouth daily.   [DISCONTINUED] warfarin (COUMADIN) 5 MG tablet 1 tablet Monday, Wednesday, Friday; 1.5 tablets other days   No facility-administered medications prior to visit.    Review of Systems  All other systems reviewed and are negative.         Objective:     BP 135/73   Pulse (!) 56   Wt 192 lb 12.8 oz (87.5 kg)   SpO2 98%   BMI 31.12 kg/m  BP Readings from Last 3 Encounters:  03/16/22 135/73  09/17/20 118/76  12/12/19 (!) 143/82   Wt Readings from Last 3 Encounters:  03/16/22 192 lb 12.8 oz (87.5 kg)  09/17/20 176 lb (79.8 kg)  12/12/19 194 lb (88 kg)      Physical Exam Vitals reviewed.    BP 135/73   Pulse (!) 56   Wt 192 lb 12.8 oz (87.5 kg)   SpO2 98%   BMI 31.12  kg/m   General Appearance:    Alert, cooperative, no distress, appears stated age  Head:    Normocephalic, without obvious abnormality, atraumatic  Eyes:    PERRL, conjunctiva/corneas clear, EOM's intact, fundi    benign, both eyes  Ears:    Normal TM's and external ear canals, both ears  Nose:   Nares normal, septum midline, mucosa normal, no drainage    or sinus tenderness  Throat:   Lips, mucosa, and tongue normal; teeth and gums normal  Neck:   Supple, symmetrical, trachea midline, no adenopathy;    thyroid:  no enlargement/tenderness/nodules; no carotid   bruit or JVD  Back:     Symmetric, no curvature, ROM normal, no CVA tenderness  Lungs:     Clear to auscultation bilaterally, respirations unlabored  Chest Wall:    No tenderness or deformity   Heart:    Regular rate and rhythm, S1 and S2 normal, no murmur, rub   or gallop     Abdomen:     Soft, non-tender, bowel sounds  active all four quadrants,    no masses, no organomegaly        Extremities:   Extremities normal, atraumatic, no cyanosis or edema  Pulses:   2+ and symmetric all extremities  Skin:   Skin color, texture, turgor normal, no rashes or lesions  Lymph nodes:   Cervical, supraclavicular, and axillary nodes normal  Neurologic:   CNII-XII intact, normal strength, sensation and reflexes    throughout       Assessment & Plan:    Routine Health Maintenance and Physical Exam  Immunization History  Administered Date(s) Administered   Influenza,inj,Quad PF,6+ Mos 04/12/2018   Influenza-Unspecified 06/11/2020   Moderna Sars-Covid-2 Vaccination 09/29/2019, 10/30/2019, 06/25/2020   Tdap 04/12/2018   Zoster Recombinat (Shingrix) 09/17/2020, 03/16/2022    Health Maintenance  Topic Date Due   COVID-19 Vaccine (4 - Moderna series) 04/01/2022 (Originally 08/20/2020)   INFLUENZA VACCINE  10/10/2022 (Originally 02/09/2022)   HIV Screening  03/17/2023 (Originally 11/17/1981)   MAMMOGRAM  12/18/2022   PAP SMEAR-Modifier  05/03/2023   Fecal DNA (Cologuard)  10/06/2024   TETANUS/TDAP  04/12/2028   Hepatitis C Screening  Completed   Zoster Vaccines- Shingrix  Completed   HPV VACCINES  Aged Out    Discussed health benefits of physical activity, and encouraged her to engage in regular exercise appropriate for her age and condition.  .. Discussed 150 minutes of exercise a week.  Encouraged vitamin D 1000 units and Calcium 1300mg  or 4 servings of dairy a day.  PHQ no concerns Fasting labs ordered today Mammogram UtD Cologuard UTD Shingles 2nd shot given today Declined flu/covid/HIV Check INR to make sure in good range for chronic anticoagulation Follow up in 6 months    , PA-C

## 2022-03-17 LAB — CBC WITH DIFFERENTIAL/PLATELET
Absolute Monocytes: 409 cells/uL (ref 200–950)
Basophils Absolute: 52 cells/uL (ref 0–200)
Basophils Relative: 1.1 %
Eosinophils Absolute: 150 cells/uL (ref 15–500)
Eosinophils Relative: 3.2 %
HCT: 40.1 % (ref 35.0–45.0)
Hemoglobin: 13.3 g/dL (ref 11.7–15.5)
Lymphs Abs: 1410 cells/uL (ref 850–3900)
MCH: 31.7 pg (ref 27.0–33.0)
MCHC: 33.2 g/dL (ref 32.0–36.0)
MCV: 95.5 fL (ref 80.0–100.0)
MPV: 10.4 fL (ref 7.5–12.5)
Monocytes Relative: 8.7 %
Neutro Abs: 2679 cells/uL (ref 1500–7800)
Neutrophils Relative %: 57 %
Platelets: 274 10*3/uL (ref 140–400)
RBC: 4.2 10*6/uL (ref 3.80–5.10)
RDW: 12.6 % (ref 11.0–15.0)
Total Lymphocyte: 30 %
WBC: 4.7 10*3/uL (ref 3.8–10.8)

## 2022-03-17 LAB — LIPID PANEL W/REFLEX DIRECT LDL
Cholesterol: 265 mg/dL — ABNORMAL HIGH (ref <200)
HDL: 78 mg/dL (ref >=50)
LDL Cholesterol (Calc): 170 mg/dL (calc) — ABNORMAL HIGH
Non-HDL Cholesterol (Calc): 187 mg/dL (calc) — ABNORMAL HIGH (ref <130)
Total CHOL/HDL Ratio: 3.4 (calc) (ref <5.0)
Triglycerides: 70 mg/dL (ref <150)

## 2022-03-17 LAB — COMPLETE METABOLIC PANEL WITH GFR
AG Ratio: 1.7 (calc) (ref 1.0–2.5)
ALT: 13 U/L (ref 6–29)
AST: 18 U/L (ref 10–35)
Albumin: 4 g/dL (ref 3.6–5.1)
Alkaline phosphatase (APISO): 40 U/L (ref 37–153)
BUN: 12 mg/dL (ref 7–25)
CO2: 26 mmol/L (ref 20–32)
Calcium: 9 mg/dL (ref 8.6–10.4)
Chloride: 106 mmol/L (ref 98–110)
Creat: 0.76 mg/dL (ref 0.50–1.03)
Globulin: 2.3 g/dL (calc) (ref 1.9–3.7)
Glucose, Bld: 103 mg/dL — ABNORMAL HIGH (ref 65–99)
Potassium: 4.5 mmol/L (ref 3.5–5.3)
Sodium: 141 mmol/L (ref 135–146)
Total Bilirubin: 0.5 mg/dL (ref 0.2–1.2)
Total Protein: 6.3 g/dL (ref 6.1–8.1)
eGFR: 92 mL/min/{1.73_m2} (ref >=60)

## 2022-03-17 LAB — PROTIME-INR
INR: 1.8 — ABNORMAL HIGH
Prothrombin Time: 18.1 s — ABNORMAL HIGH (ref 9.0–11.5)

## 2022-03-17 LAB — TSH: TSH: 2.1 mIU/L

## 2022-03-17 NOTE — Progress Notes (Signed)
Sarah Harrington,   Liver enzymes much better.  INR not therapeutic increase coumadin to 1.5mg  all days except 1mg  on Mondays and Fridays Recheck in 4 weeks Hemoglobin and WBC looks great Thyroid looks great.  Cholesterol went quite a bit up but you said you were not on it for a month. Go back on lipitor and we can recheck in 6 months.

## 2022-03-18 ENCOUNTER — Encounter: Payer: Self-pay | Admitting: Physician Assistant

## 2022-03-18 ENCOUNTER — Other Ambulatory Visit: Payer: Self-pay | Admitting: Neurology

## 2022-03-18 DIAGNOSIS — Z7901 Long term (current) use of anticoagulants: Secondary | ICD-10-CM

## 2022-05-07 ENCOUNTER — Encounter: Payer: Self-pay | Admitting: Physician Assistant

## 2022-06-17 LAB — PROTIME-INR
INR: 2.3 — ABNORMAL HIGH
Prothrombin Time: 23.3 s — ABNORMAL HIGH (ref 9.0–11.5)

## 2022-06-21 NOTE — Progress Notes (Signed)
Continue same dose of coumadin. Do you need refills?

## 2022-09-20 ENCOUNTER — Ambulatory Visit (INDEPENDENT_AMBULATORY_CARE_PROVIDER_SITE_OTHER): Payer: 59 | Admitting: Physician Assistant

## 2022-09-20 ENCOUNTER — Encounter: Payer: Self-pay | Admitting: Physician Assistant

## 2022-09-20 VITALS — BP 131/68 | HR 55 | Ht 66.0 in | Wt 181.0 lb

## 2022-09-20 DIAGNOSIS — L309 Dermatitis, unspecified: Secondary | ICD-10-CM | POA: Diagnosis not present

## 2022-09-20 MED ORDER — PREDNISONE 50 MG PO TABS: ORAL_TABLET | ORAL | 0 refills | Status: DC

## 2022-09-20 MED ORDER — CLOBETASOL PROPIONATE 0.05 % EX CREA: 1.0000 | TOPICAL_CREAM | Freq: Two times a day (BID) | CUTANEOUS | 0 refills | Status: DC

## 2022-09-20 NOTE — Patient Instructions (Signed)
Contact Dermatitis Dermatitis is redness, soreness, and swelling (inflammation) of the skin. Contact dermatitis is a reaction to certain substances that touch the skin. There are two types of this condition: Irritant contact dermatitis. This is the most common type. It happens when something irritates your skin, such as when your hands get dry from washing them too often with soap. You can get this type of reaction even if you have not been exposed to the irritant before. Allergic contact dermatitis. This type is caused by a substance that you are allergic to, such as poison ivy. It occurs when you have been exposed to the substance (allergen) and form a sensitivity to it. In some cases, the reaction may start soon after your first exposure to the allergen. In other cases, it may not start until you are exposed to the allergen again. It may then occur every time you are exposed to the allergen in the future. What are the causes? Irritant contact dermatitis is often caused by exposure to: Makeup. Soaps, detergents, and bleaches. Acids. Metal salts, such as nickel. Allergic contact dermatitis is often caused by exposure to: Poisonous plants. Chemicals. Jewelry. Latex. Medicines. Preservatives in products, such as clothes. What increases the risk? You are more likely to get this condition if you have: A job that exposes you to irritants or allergens. Certain medical conditions. These include asthma and eczema. What are the signs or symptoms? Symptoms of this condition may occur in any place on your body that has been touched by the irritant. Symptoms include: Dryness, flaking, or cracking. Redness. Itching. Pain or a burning feeling. Blisters. Drainage of small amounts of blood or clear fluid from skin cracks. With allergic contact dermatitis, there may also be swelling in areas such as the eyelids, mouth, or genitals. How is this diagnosed? This condition is diagnosed with a medical  history and physical exam. A patch skin test may be done to help figure out the cause. If the condition is related to your job, you may need to see an expert in health problems in the workplace (occupational medicine specialist). How is this treated? This condition is treated by staying away from the cause of the reaction and protecting your skin from further contact. Treatment may also include: Steroid creams or ointments. Steroid medicines may need be taken by mouth (orally) in more severe cases. Antibiotics or medicines applied to the skin to kill bacteria (antibacterial ointments). These may be needed if a skin infection is present. Antihistamines. These may be taken orally or put on as a lotion to ease itching. A bandage (dressing). Follow these instructions at home: Skin care Moisturize your skin as needed. Put cool, wet cloths (cool compresses) on the affected areas. Try applying baking soda paste to your skin. Stir water into baking soda until it has the consistency of a paste. Do not scratch your skin. Avoid friction to the affected area. Avoid the use of soaps, perfumes, and dyes. Check the affected areas every day for signs of infection. Check for: More redness, swelling, or pain. More fluid or blood. Warmth. Pus or a bad smell. Medicines Take or apply over-the-counter and prescription medicines only as told by your health care provider. If you were prescribed antibiotics, take or apply them as told by your health care provider. Do not stop using the antibiotic even if you start to feel better. Bathing Try taking a bath with: Epsom salts. Follow the instructions on the packaging. You can get these at your local pharmacy   or grocery store. Baking soda. Pour a small amount into the bath as told by your health care provider. Colloidal oatmeal. Follow the instructions on the packaging. You can get this at your local pharmacy or grocery store. Bathe less often. This may mean bathing  every other day. Bathe in lukewarm water. Avoid using hot water. Bandage care If you were given a dressing, change it as told by your health care provider. Wash your hands with soap and water for at least 20 seconds before and after you change your dressing. If soap and water are not available, use hand sanitizer. General instructions Avoid the substance that caused your reaction. If you do not know what caused it, keep a journal to try to track what caused it. Write down: What you eat and drink. What cosmetics you use. What you wear in the affected area. This includes jewelry. Contact a health care provider if: Your condition does not get better with treatment. Your condition gets worse. You have any signs of infection. You have a fever. You have new symptoms. Your bone or joint under the affected area becomes painful after the skin has healed. Get help right away if: You notice red streaks coming from the affected area. The affected area turns darker. You have trouble breathing. This information is not intended to replace advice given to you by your health care provider. Make sure you discuss any questions you have with your health care provider. Document Revised: 01/01/2022 Document Reviewed: 01/01/2022 Elsevier Patient Education  2023 [employer]  

## 2022-09-20 NOTE — Progress Notes (Signed)
   Acute Office Visit  Subjective:     Patient ID: Sarah Harrington, female    DOB: [birth year], 57 y.o.   MRN: [id number]  Chief Complaint  Patient presents with   Pruritis    HPI Patient is in today for 1 month of left lower anterior leg itchy and redness. No pain, fever, chills. She has used lotion but with no relief. Denies any known contact to that leg that has been different. Hx of contact dermatitis. No new detergents, lotions, medications.  .. Active Ambulatory Problems    Diagnosis Date Noted   OTHER AND UNSPECIFIED COAGULATION DEFECTS 01/26/2010   CONTACT DERMATITIS&OTHER ECZEMA DUE UNSPEC CAUSE 12/10/2009   EDEMA 01/26/2010   Antiphospholipid antibody syndrome ([place]) 05/18/2011   DVT (deep venous thrombosis) ([place]) 05/18/2011   Antiphospholipid syndrome ([place]) 06/18/2011   History of pulmonary embolus (PE) 02/13/2012   Abnormal mammogram of left breast 08/30/2014   Elevated LDL cholesterol level 04/14/2018   Class 1 obesity due to excess calories without serious comorbidity with body mass index (BMI) of 34.0 to 34.9 in adult 04/15/2018   Keratosis pilaris 09/24/2019   Skin rash 12/14/2019   Chronic anticoagulation 12/14/2019   Elevated liver enzymes 09/19/2020   Resolved Ambulatory Problems    Diagnosis Date Noted   Routine physical examination 08/30/2014   No Additional Past Medical History    ROS See HPI.      Objective:    BP 131/68   Pulse (!) 55   Ht 5\' 6"  (1.676 m)   Wt 181 lb (82.1 kg)   SpO2 100%   BMI 29.21 kg/m  BP Readings from Last 3 Encounters:  09/20/22 131/68  03/16/22 135/73  09/17/20 118/76   Wt Readings from Last 3 Encounters:  09/20/22 181 lb (82.1 kg)  03/16/22 192 lb 12.8 oz (87.5 kg)  09/17/20 176 lb (79.8 kg)      Physical Exam  Left lower anterior leg: erythematous blanchable base with excoriations and dried and open scabs Not warm to touch, not tender       Assessment & Plan:  Marland KitchenMarland KitchenMckenzee was seen today for  pruritis.  Diagnoses and all orders for this visit:  Dermatitis -     clobetasol cream (TEMOVATE) 0.05 %; Apply 1 Application topically 2 (two) times daily. -     predniSONE (DELTASONE) 50 MG tablet; Take one tablet for 5 days.   Seems allergic over bacterial Trial of topical steroid for 2-3 days if not improvement start prednisone burst Lotion without any scent  Cool compresses for itch Follow up as needed or if symptoms change or worsen  Sarah Planas, PA-C

## 2023-03-08 ENCOUNTER — Other Ambulatory Visit: Payer: Self-pay | Admitting: Physician Assistant

## 2023-04-11 ENCOUNTER — Other Ambulatory Visit: Payer: Self-pay | Admitting: Physician Assistant

## 2023-04-11 DIAGNOSIS — Z86711 Personal history of pulmonary embolism: Secondary | ICD-10-CM

## 2023-04-11 DIAGNOSIS — D6861 Antiphospholipid syndrome: Secondary | ICD-10-CM

## 2023-04-11 DIAGNOSIS — I82402 Acute embolism and thrombosis of unspecified deep veins of left lower extremity: Secondary | ICD-10-CM

## 2023-09-09 IMAGING — MG MM DIGITAL SCREENING BILAT W/ TOMO AND CAD
8 series · 8 of 24 positions shown · non-contrast
Comparison: Previous exam(s).

CLINICAL DATA: Screening.

EXAM:
DIGITAL SCREENING BILATERAL MAMMOGRAM WITH TOMOSYNTHESIS AND CAD
TECHNIQUE: Bilateral screening digital craniocaudal and mediolateral oblique
mammograms were obtained. Bilateral screening digital breast
tomosynthesis was performed. The images were evaluated with
computer-aided detection.

[L MLO synth-2D]
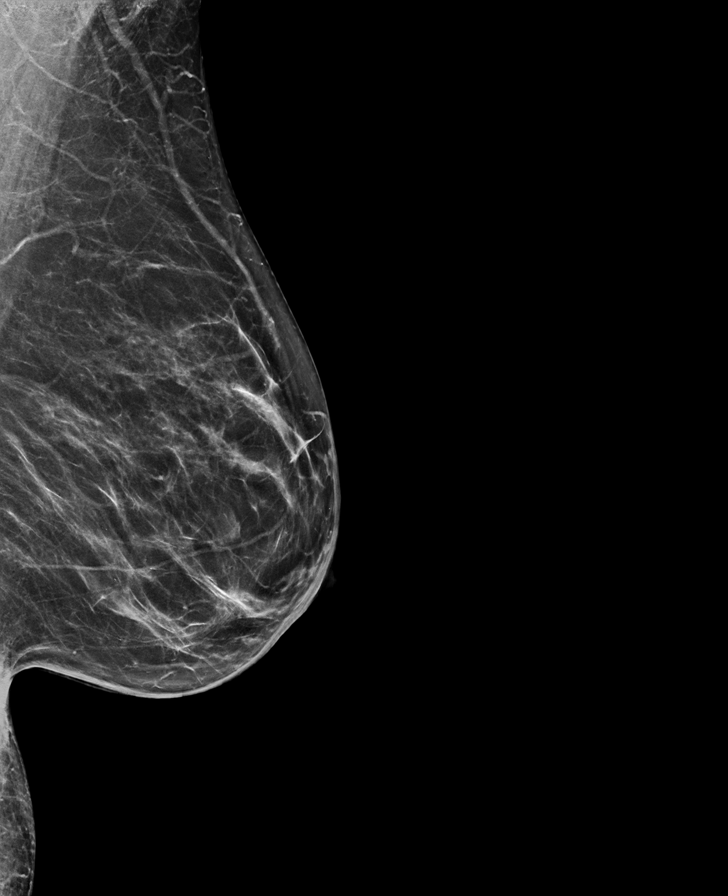

[R CC synth-2D]
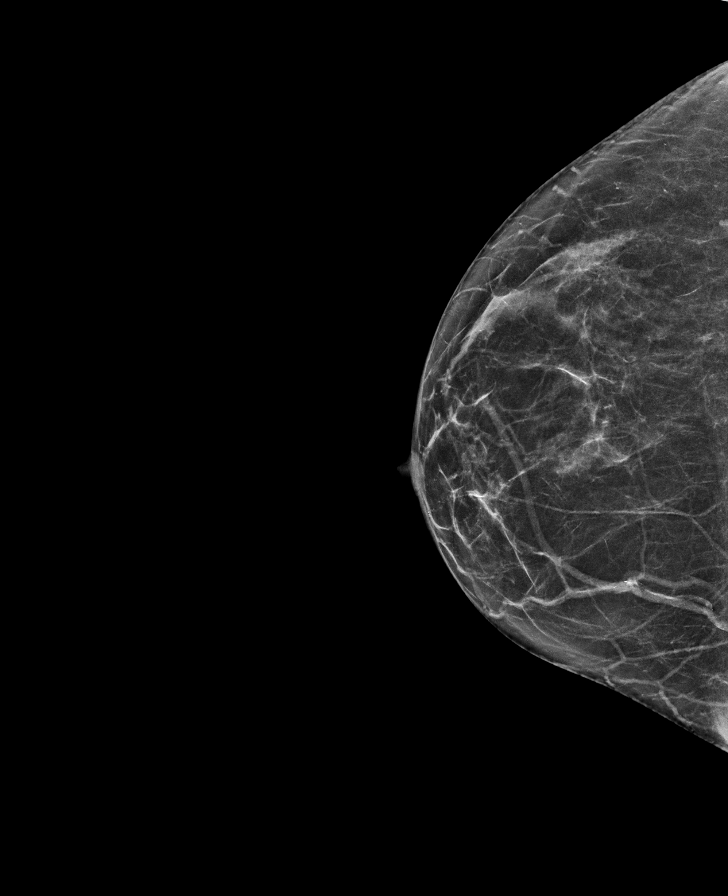

[R MLO synth-2D]
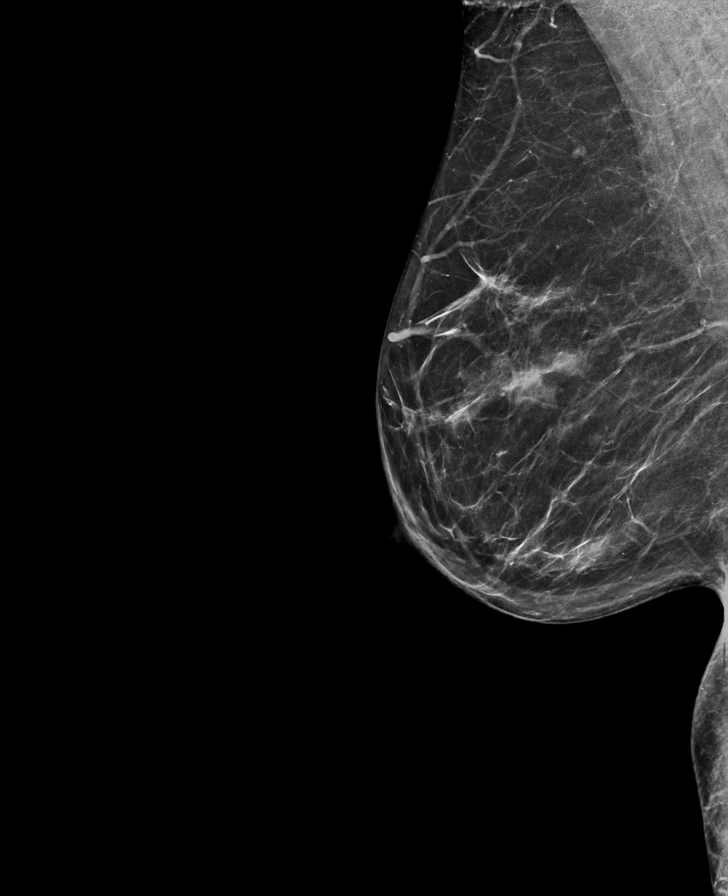

[L CC synth-2D]
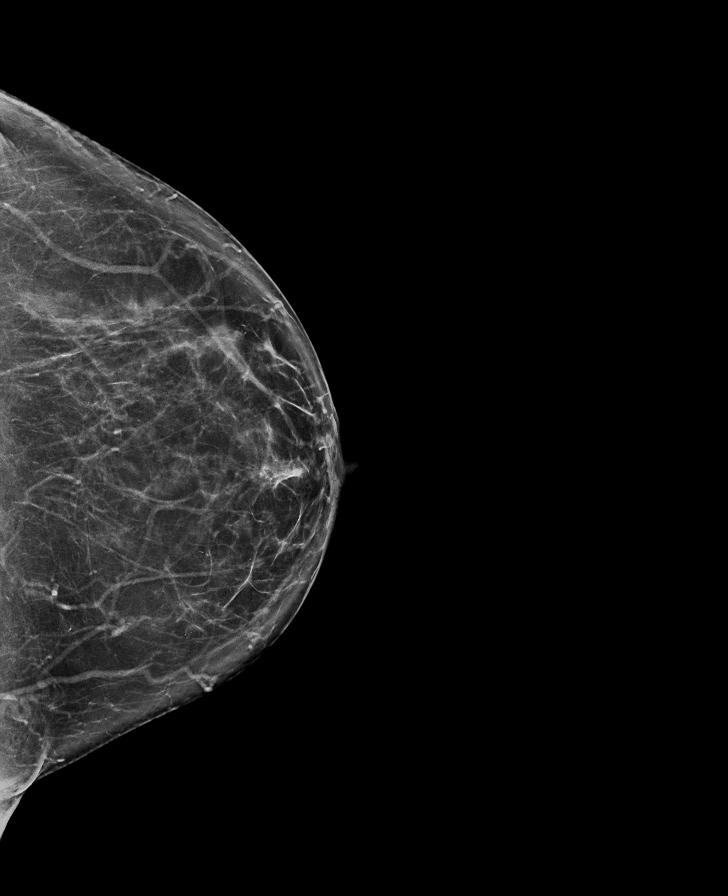

[L CC tomo · tomo slice 37/72.0]
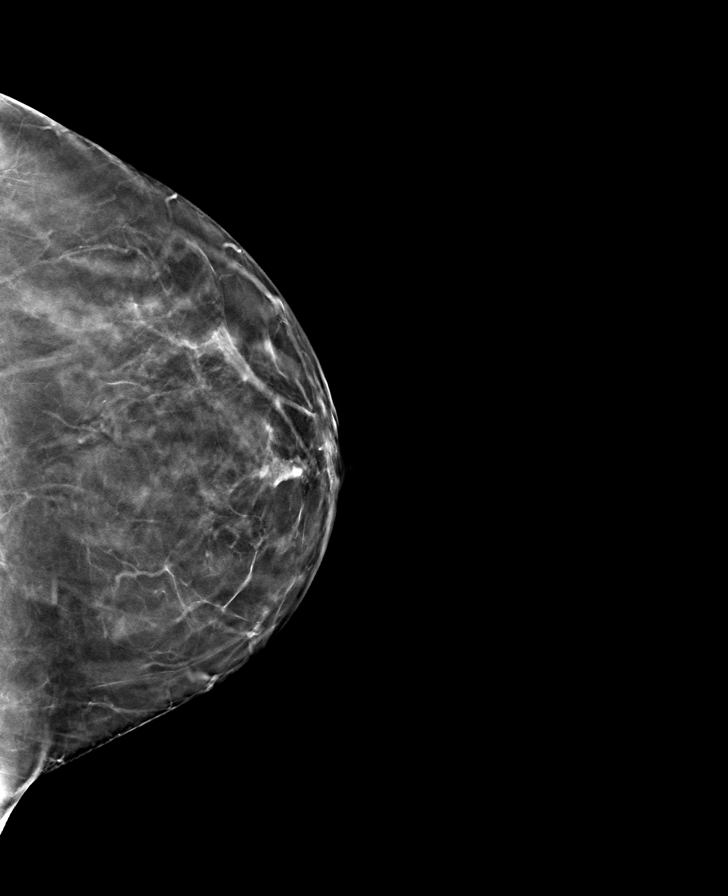

[L MLO tomo · tomo slice 35/70.0]
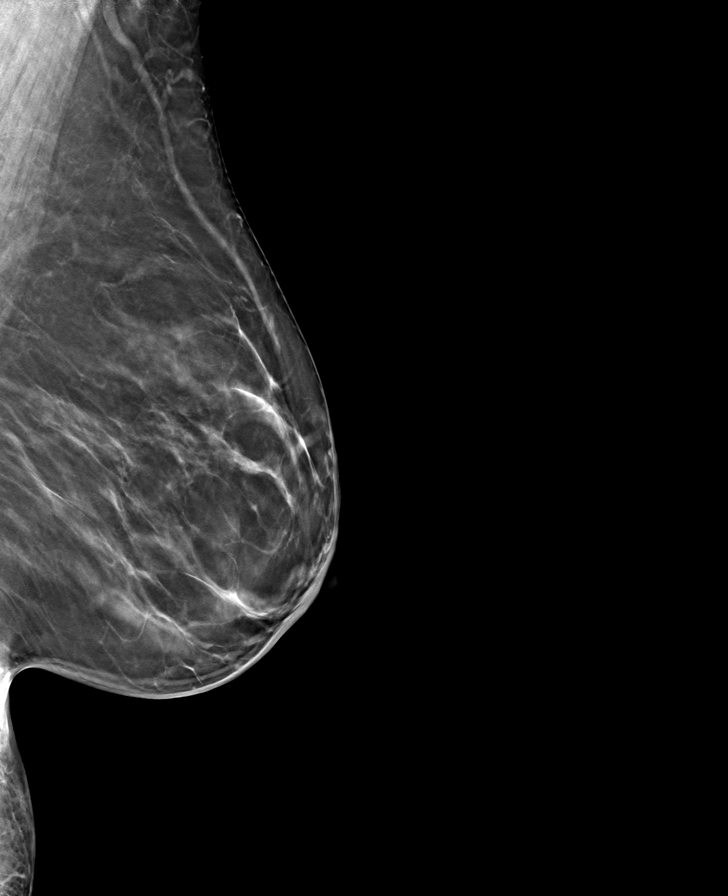

[R CC tomo · tomo slice 32/63.0]
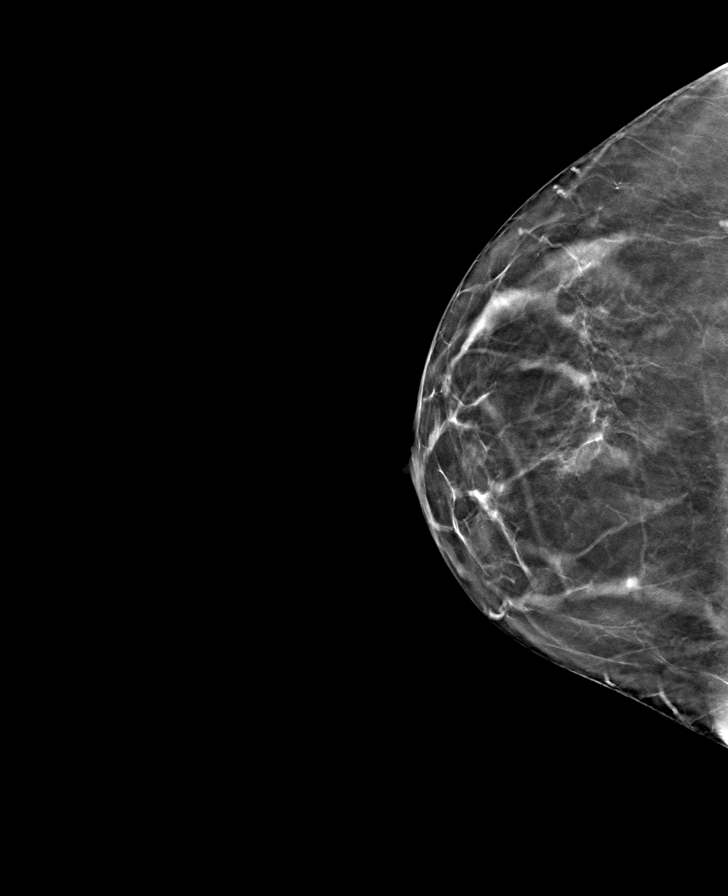

[R MLO tomo · tomo slice 35/68.0]
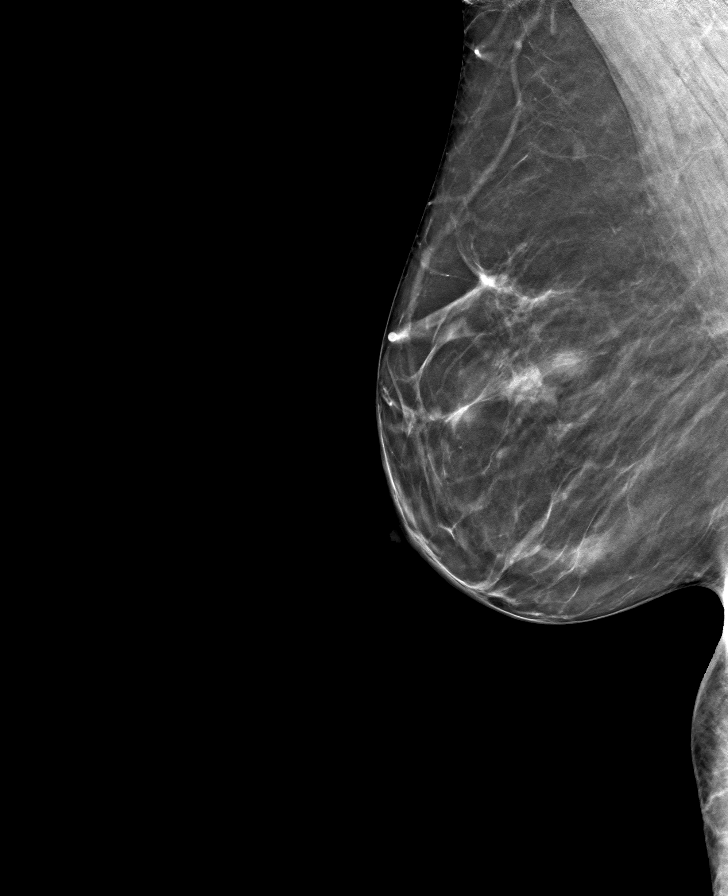

[8 of 24 positions shown; findings below may reference images not displayed]

ACR Breast Density Category b: There are scattered areas of
fibroglandular density.
FINDINGS: There are no findings suspicious for malignancy.
IMPRESSION: No mammographic evidence of malignancy. A result letter of this
screening mammogram will be mailed directly to the patient.

RECOMMENDATION:
Screening mammogram in one year. (Code:51-O-LD2)

BI-RADS CATEGORY  1: Negative.

## 2023-10-12 ENCOUNTER — Ambulatory Visit: Admitting: Physician Assistant

## 2023-10-12 VITALS — BP 124/78 | HR 77 | Temp 98.2°F

## 2023-10-12 DIAGNOSIS — R5381 Other malaise: Secondary | ICD-10-CM

## 2023-10-12 DIAGNOSIS — R5383 Other fatigue: Secondary | ICD-10-CM | POA: Diagnosis not present

## 2023-10-12 DIAGNOSIS — J029 Acute pharyngitis, unspecified: Secondary | ICD-10-CM

## 2023-10-12 LAB — POCT RAPID STREP A (OFFICE): Rapid Strep A Screen: NEGATIVE

## 2023-10-12 LAB — POCT INFLUENZA A/B
Influenza A, POC: NEGATIVE
Influenza B, POC: NEGATIVE

## 2023-10-12 LAB — POC COVID19 BINAXNOW: SARS Coronavirus 2 Ag: NEGATIVE

## 2023-10-12 MED ORDER — FLUTICASONE PROPIONATE 50 MCG/ACT NA SUSP: 2.0000 | Freq: Every day | NASAL | 0 refills | Status: DC

## 2023-10-12 MED ORDER — METHYLPREDNISOLONE SODIUM SUCC 125 MG IJ SOLR
125.0000 mg | Freq: Once | INTRAMUSCULAR | Status: AC
  Administered 2023-10-12: 125 mg via INTRAMUSCULAR

## 2023-10-12 NOTE — Patient Instructions (Signed)

## 2023-10-12 NOTE — Progress Notes (Unsigned)
 Acute Office Visit  Subjective:     Patient ID: Sarah Harrington, female    DOB: 09/14/1965, 58 y.o.   MRN: [id number]  Chief Complaint  Patient presents with   Cough    HPI Patient is in today for day one of sore throat and feeling of tiredness. She took dayquil at 4:30am and did get some better. Worsening since 8 this morning. Very sore throat, ear fullness and feeling of fatigue. No fever, chills, body aches, SOB. Cough is dry. No known sick contacts.   ROS See HPI.      Objective:    BP 124/78   Pulse 77   Temp 98.2 F (36.8 C)  BP Readings from Last 3 Encounters:  10/14/23 124/78  09/20/22 131/68  03/16/22 135/73   Wt Readings from Last 3 Encounters:  09/20/22 181 lb (82.1 kg)  03/16/22 192 lb 12.8 oz (87.5 kg)  09/17/20 176 lb (79.8 kg)      Physical Exam Constitutional:      Appearance: Normal appearance.  HENT:     Head: Normocephalic.     Right Ear: Tympanic membrane, ear canal and external ear normal. There is no impacted cerumen.     Left Ear: Tympanic membrane, ear canal and external ear normal. There is no impacted cerumen.     Nose: Congestion present. No rhinorrhea.     Mouth/Throat:     Mouth: Mucous membranes are moist.     Pharynx: Posterior oropharyngeal erythema present. No oropharyngeal exudate.     Comments: Oropharynx erythematous Eyes:     Extraocular Movements: Extraocular movements intact.     Conjunctiva/sclera: Conjunctivae normal.     Pupils: Pupils are equal, round, and reactive to light.  Cardiovascular:     Rate and Rhythm: Normal rate and regular rhythm.  Pulmonary:     Effort: Pulmonary effort is normal.     Breath sounds: Normal breath sounds.  Musculoskeletal:     Cervical back: Normal range of motion and neck supple. No tenderness.  Lymphadenopathy:     Cervical: Cervical adenopathy present.  Neurological:     General: No focal deficit present.     Mental Status: She is alert and oriented to person, place, and  time.  Psychiatric:        Mood and Affect: Mood normal.      Results for orders placed or performed in visit on 10/12/23  POCT Influenza A/B  Result Value Ref Range   Influenza A, POC Negative Negative   Influenza B, POC Negative Negative  POC COVID-19  Result Value Ref Range   SARS Coronavirus 2 Ag Negative Negative  POCT rapid strep A  Result Value Ref Range   Rapid Strep A Screen Negative Negative        Assessment & Plan:  Marland KitchenMarland KitchenHayley was seen today for cough.  Diagnoses and all orders for this visit:  Acute pharyngitis, unspecified etiology -     fluticasone (FLONASE) 50 MCG/ACT nasal spray; Place 2 sprays into both nostrils daily. -     methylPREDNISolone sodium succinate (SOLU-MEDROL) 125 mg/2 mL injection 125 mg  Sore throat -     POCT Influenza A/B -     POC COVID-19 -     POCT rapid strep A -     fluticasone (FLONASE) 50 MCG/ACT nasal spray; Place 2 sprays into both nostrils daily. -     methylPREDNISolone sodium succinate (SOLU-MEDROL) 125 mg/2 mL injection 125 mg  Malaise and fatigue -  POCT Influenza A/B -     POC COVID-19 -     POCT rapid strep A   Negative for flu/covid/strep Day one of symptoms Discussed symptomatic care Flonase sent to pharmacy Solumedrol given in office to hep with any allergy inflammation and ST Follow up as needed if symptoms persist or worsen    Tandy Gaw, PA-C

## 2023-10-14 ENCOUNTER — Encounter: Payer: Self-pay | Admitting: Physician Assistant

## 2023-10-14 ENCOUNTER — Other Ambulatory Visit: Payer: Self-pay | Admitting: Physician Assistant

## 2023-10-14 DIAGNOSIS — Z1231 Encounter for screening mammogram for malignant neoplasm of breast: Secondary | ICD-10-CM

## 2023-10-27 ENCOUNTER — Ambulatory Visit

## 2023-10-27 DIAGNOSIS — Z1231 Encounter for screening mammogram for malignant neoplasm of breast: Secondary | ICD-10-CM | POA: Diagnosis not present

## 2023-11-01 ENCOUNTER — Encounter: Payer: Self-pay | Admitting: Physician Assistant

## 2023-11-01 NOTE — Progress Notes (Signed)
 Normal mammogram. Follow up in 1 year.

## 2023-11-04 ENCOUNTER — Encounter: Admitting: Physician Assistant

## 2023-11-15 ENCOUNTER — Encounter: Payer: Self-pay | Admitting: Physician Assistant

## 2023-11-15 ENCOUNTER — Ambulatory Visit (INDEPENDENT_AMBULATORY_CARE_PROVIDER_SITE_OTHER): Admitting: Physician Assistant

## 2023-11-15 VITALS — BP 118/70 | HR 53 | Ht 66.0 in | Wt 199.0 lb

## 2023-11-15 DIAGNOSIS — Z131 Encounter for screening for diabetes mellitus: Secondary | ICD-10-CM

## 2023-11-15 DIAGNOSIS — Z6832 Body mass index (BMI) 32.0-32.9, adult: Secondary | ICD-10-CM

## 2023-11-15 DIAGNOSIS — Z0000 Encounter for general adult medical examination without abnormal findings: Secondary | ICD-10-CM | POA: Diagnosis not present

## 2023-11-15 DIAGNOSIS — E7800 Pure hypercholesterolemia, unspecified: Secondary | ICD-10-CM | POA: Diagnosis not present

## 2023-11-15 DIAGNOSIS — E66811 Obesity, class 1: Secondary | ICD-10-CM

## 2023-11-15 DIAGNOSIS — E6609 Other obesity due to excess calories: Secondary | ICD-10-CM

## 2023-11-15 DIAGNOSIS — I83893 Varicose veins of bilateral lower extremities with other complications: Secondary | ICD-10-CM

## 2023-11-15 DIAGNOSIS — Z7901 Long term (current) use of anticoagulants: Secondary | ICD-10-CM

## 2023-11-15 DIAGNOSIS — Z780 Asymptomatic menopausal state: Secondary | ICD-10-CM | POA: Diagnosis not present

## 2023-11-15 NOTE — Progress Notes (Signed)
 Complete physical exam  Patient: Sarah Harrington   DOB: [birth year]   58 y.o. Female  MRN: [id number]  Subjective:    No chief complaint on file.   Sarah Harrington is a 58 y.o. female who presents today for a complete physical exam. She reports consuming a general diet.  Pt walks her dogs 1.52miles a day.  She generally feels well. She reports sleeping well. She does not have additional problems to discuss today.    Most recent fall risk assessment:    09/20/2022   10:36 AM  Fall Risk   Falls in the past year? 0  Number falls in past yr: 0  Injury with Fall? 0  Risk for fall due to : No Fall Risks  Follow up Falls evaluation completed     Most recent depression screenings:    11/15/2023    8:19 AM 09/20/2022   10:36 AM  PHQ 2/9 Scores  PHQ - 2 Score 0 0  PHQ- 9 Score 2     Vision:Within last year and Dental: No current dental problems and Receives regular dental care  Patient Active Problem List   Diagnosis Date Noted   Post-menopausal 11/15/2023   Varicose veins of both legs with edema 11/15/2023   Dermatitis 09/20/2022   Elevated liver enzymes 09/19/2020   Skin rash 12/14/2019   Chronic anticoagulation 12/14/2019   Keratosis pilaris 09/24/2019   Class 1 obesity due to excess calories without serious comorbidity with body mass index (BMI) of 32.0 to 32.9 in adult 04/15/2018   Elevated LDL cholesterol level 04/14/2018   Abnormal mammogram of left breast 08/30/2014   History of pulmonary embolus (PE) 02/13/2012   Antiphospholipid syndrome (HCC) 06/18/2011   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/18/2011   DVT (deep venous thrombosis) (HCC) 05/18/2011   OTHER AND UNSPECIFIED COAGULATION DEFECTS 01/26/2010   EDEMA 01/26/2010   CONTACT DERMATITIS&OTHER ECZEMA DUE UNSPEC CAUSE 12/10/2009   Past Medical History:  Diagnosis Date   History of pulmonary embolus (PE) 02/13/2012   Past Surgical History:  Procedure Laterality Date   CESAREAN SECTION     TUBAL  LIGATION     Family History  Problem Relation Age of Onset   Hyperlipidemia Father    Cancer Maternal Grandfather        prostate   Cancer Paternal Grandmother        breast   Heart attack Paternal Grandfather    No Known Allergies    Patient Care Team: Razia Screws L, PA-C as PCP - General (Family Medicine)   Outpatient Medications Prior to Visit  Medication Sig   aspirin 81 MG tablet Take 81 mg by mouth daily.   atorvastatin  (LIPITOR) 40 MG tablet Take 1 tablet (40 mg total) by mouth daily.   clobetasol  cream (TEMOVATE ) 0.05 % Apply 1 Application topically 2 (two) times daily.   warfarin (COUMADIN ) 5 MG tablet take 1 tablet by mouth Monday, Wednesday, Friday; take 1.5 tablets all other days   [DISCONTINUED] fluticasone  (FLONASE ) 50 MCG/ACT nasal spray Place 2 sprays into both nostrils daily.   No facility-administered medications prior to visit.    Review of Systems  All other systems reviewed and are negative.         Objective:     BP 118/70   Pulse (!) 53   Ht 5\' 6"  (1.676 m)   Wt 199 lb (90.3 kg)   LMP 08/16/2014   SpO2 98%   BMI 32.12 kg/m  BP Readings from  Last 3 Encounters:  11/15/23 118/70  10/14/23 124/78  09/20/22 131/68   Wt Readings from Last 3 Encounters:  11/15/23 199 lb (90.3 kg)  09/20/22 181 lb (82.1 kg)  03/16/22 192 lb 12.8 oz (87.5 kg)      Physical Exam  BP 118/70   Pulse (!) 53   Ht 5\' 6"  (1.676 m)   Wt 199 lb (90.3 kg)   LMP 08/16/2014   SpO2 98%   BMI 32.12 kg/m   General Appearance:    Alert, cooperative, obese no distress, appears stated age  Head:    Normocephalic, without obvious abnormality, atraumatic  Eyes:    PERRL, conjunctiva/corneas clear, EOM's intact, fundi    benign, both eyes  Ears:    Normal TM's and external ear canals, both ears  Nose:   Nares normal, septum midline, mucosa normal, no drainage    or sinus tenderness  Throat:   Lips, mucosa, and tongue normal; teeth and gums normal  Neck:    Supple, symmetrical, trachea midline, no adenopathy;    thyroid:  no enlargement/tenderness/nodules; no carotid   bruit or JVD  Back:     Symmetric, no curvature, ROM normal, no CVA tenderness  Lungs:     Clear to auscultation bilaterally, respirations unlabored  Chest Wall:    No tenderness or deformity   Heart:    Regular rate and rhythm, S1 and S2 normal, no murmur, rub   or gallop  Breast Exam:    No tenderness, masses, or nipple abnormality  Abdomen:     Soft, non-tender, bowel sounds active all four quadrants,    no masses, no organomegaly        Extremities:   Extremities normal, atraumatic, no cyanosis. Varicose veins and swelling around ankles.   Pulses:   2+ and symmetric all extremities  Skin:   Skin color, texture, turgor normal, no rashes or lesions  Lymph nodes:   Cervical, supraclavicular, and axillary nodes normal  Neurologic:   CNII-XII intact, normal strength, sensation and reflexes    throughout      Assessment & Plan:    Routine Health Maintenance and Physical Exam  Immunization History  Administered Date(s) Administered   Influenza,inj,Quad PF,6+ Mos 04/12/2018   Influenza-Unspecified 06/11/2020   Moderna Sars-Covid-2 Vaccination 09/29/2019, 10/30/2019, 06/25/2020   Tdap 04/12/2018   Zoster Recombinant(Shingrix ) 09/17/2020, 03/16/2022    Health Maintenance  Topic Date Due   Cervical Cancer Screening (HPV/Pap Cotest)  05/03/2023   COVID-19 Vaccine (4 - 2024-25 season) 12/01/2023 (Originally 03/13/2023)   HIV Screening  11/14/2024 (Originally 11/17/1980)   INFLUENZA VACCINE  02/10/2024   Fecal DNA (Cologuard)  10/06/2024   MAMMOGRAM  10/26/2024   DTaP/Tdap/Td (2 - Td or Tdap) 04/12/2028   Hepatitis C Screening  Completed   Zoster Vaccines- Shingrix   Completed   HPV VACCINES  Aged Out   Meningococcal B Vaccine  Aged Out    Discussed health benefits of physical activity, and encouraged her to engage in regular exercise appropriate for her age and  condition.   Aaron Aas.Diagnoses and all orders for this visit:  Routine physical examination -     CBC with Differential/Platelet -     CMP14+EGFR -     Lipid panel -     TSH -     VITAMIN D 25 Hydroxy (Vit-D Deficiency, Fractures) -     Protime-INR  Chronic anticoagulation -     Protime-INR  Elevated LDL cholesterol level -     Lipid  panel  Post-menopausal -     VITAMIN D 25 Hydroxy (Vit-D Deficiency, Fractures)  Screening for diabetes mellitus -     CMP14+EGFR  Class 1 obesity due to excess calories without serious comorbidity with body mass index (BMI) of 32.0 to 32.9 in adult  Varicose veins of both legs with edema   .Aaron Aas Discussed 150 minutes of exercise a week.  Encouraged vitamin D 1000 units and Calcium  1300mg  or 4 servings of dairy a day.  PHQ/GAD no concerns Fasting labs ordered today Vitals look great Mammogram and colonoscopy UTD Need pap, pt declines today.  Vaccines UTD Declines covid booster  .Aaron AasDiscussed low carb diet with 1500 calories and 80g of protein.  Exercising at least 150 minutes a week.  [employer] could be a Chief Technology Officer.   Encouraged compression stockings for varicose veins and swelling   Sandy Crumb, PA-C

## 2023-11-15 NOTE — Patient Instructions (Signed)

## 2023-11-16 ENCOUNTER — Other Ambulatory Visit: Payer: Self-pay | Admitting: Physician Assistant

## 2023-11-16 ENCOUNTER — Encounter: Payer: Self-pay | Admitting: Physician Assistant

## 2023-11-16 DIAGNOSIS — E559 Vitamin D deficiency, unspecified: Secondary | ICD-10-CM | POA: Insufficient documentation

## 2023-11-16 LAB — CMP14+EGFR
ALT: 20 IU/L (ref 0–32)
AST: 24 IU/L (ref 0–40)
Albumin: 4 g/dL (ref 3.8–4.9)
Alkaline Phosphatase: 55 IU/L (ref 44–121)
BUN/Creatinine Ratio: 14 (ref 9–23)
BUN: 10 mg/dL (ref 6–24)
Bilirubin Total: 0.5 mg/dL (ref 0.0–1.2)
CO2: 21 mmol/L (ref 20–29)
Calcium: 9.1 mg/dL (ref 8.7–10.2)
Chloride: 104 mmol/L (ref 96–106)
Creatinine, Ser: 0.72 mg/dL (ref 0.57–1.00)
Globulin, Total: 2.4 g/dL (ref 1.5–4.5)
Glucose: 95 mg/dL (ref 70–99)
Potassium: 4.6 mmol/L (ref 3.5–5.2)
Sodium: 140 mmol/L (ref 134–144)
Total Protein: 6.4 g/dL (ref 6.0–8.5)
eGFR: 98 mL/min/{1.73_m2} (ref >59)

## 2023-11-16 LAB — LIPID PANEL
Chol/HDL Ratio: 2.2 ratio (ref 0.0–4.4)
Cholesterol, Total: 175 mg/dL (ref 100–199)
HDL: 79 mg/dL (ref >39)
LDL Chol Calc (NIH): 84 mg/dL (ref 0–99)
Triglycerides: 61 mg/dL (ref 0–149)
VLDL Cholesterol Cal: 12 mg/dL (ref 5–40)

## 2023-11-16 LAB — CBC WITH DIFFERENTIAL/PLATELET
Basophils Absolute: 0 10*3/uL (ref 0.0–0.2)
Basos: 1 %
EOS (ABSOLUTE): 0.2 10*3/uL (ref 0.0–0.4)
Eos: 4 %
Hematocrit: 42.2 % (ref 34.0–46.6)
Hemoglobin: 13.9 g/dL (ref 11.1–15.9)
Immature Grans (Abs): 0 10*3/uL (ref 0.0–0.1)
Immature Granulocytes: 0 %
Lymphocytes Absolute: 1.2 10*3/uL (ref 0.7–3.1)
Lymphs: 31 %
MCH: 32 pg (ref 26.6–33.0)
MCHC: 32.9 g/dL (ref 31.5–35.7)
MCV: 97 fL (ref 79–97)
Monocytes Absolute: 0.3 10*3/uL (ref 0.1–0.9)
Monocytes: 9 %
Neutrophils Absolute: 2.1 10*3/uL (ref 1.4–7.0)
Neutrophils: 55 %
Platelets: 281 10*3/uL (ref 150–450)
RBC: 4.35 x10E6/uL (ref 3.77–5.28)
RDW: 13.1 % (ref 11.7–15.4)
WBC: 3.8 10*3/uL (ref 3.4–10.8)

## 2023-11-16 LAB — PROTIME-INR
INR: 2 — ABNORMAL HIGH (ref 0.9–1.2)
Prothrombin Time: 21.5 s — ABNORMAL HIGH (ref 9.1–12.0)

## 2023-11-16 LAB — VITAMIN D 25 HYDROXY (VIT D DEFICIENCY, FRACTURES): Vit D, 25-Hydroxy: 17.3 ng/mL — ABNORMAL LOW (ref 30.0–100.0)

## 2023-11-16 LAB — TSH: TSH: 2.49 u[IU]/mL (ref 0.450–4.500)

## 2023-11-16 MED ORDER — ATORVASTATIN CALCIUM 40 MG PO TABS: 40.0000 mg | ORAL_TABLET | Freq: Every day | ORAL | 3 refills | Status: AC

## 2023-11-16 NOTE — Progress Notes (Signed)
 Edison,   Vitamin d is really low. I suggest 5000 units daily with diary to help you absorb. Recheck in 3 months.   INR in 2-3 range.   Kidney, liver, glucose look good.   Thyroid normal.   Cholesterol looks GREAT.

## 2024-04-30 ENCOUNTER — Other Ambulatory Visit: Payer: Self-pay | Admitting: Physician Assistant

## 2024-04-30 DIAGNOSIS — Z86711 Personal history of pulmonary embolism: Secondary | ICD-10-CM

## 2024-04-30 DIAGNOSIS — I82402 Acute embolism and thrombosis of unspecified deep veins of left lower extremity: Secondary | ICD-10-CM

## 2024-04-30 DIAGNOSIS — D6861 Antiphospholipid syndrome: Secondary | ICD-10-CM

## 2024-07-27 ENCOUNTER — Ambulatory Visit: Admitting: Physician Assistant

## 2024-08-17 ENCOUNTER — Ambulatory Visit: Admitting: Physician Assistant

## 2024-08-17 ENCOUNTER — Encounter: Payer: Self-pay | Admitting: Physician Assistant

## 2024-08-17 VITALS — BP 128/70 | HR 52 | Ht 66.0 in | Wt 192.8 lb

## 2024-08-17 DIAGNOSIS — S86811A Strain of other muscle(s) and tendon(s) at lower leg level, right leg, initial encounter: Secondary | ICD-10-CM

## 2024-08-17 DIAGNOSIS — S86819A Strain of other muscle(s) and tendon(s) at lower leg level, unspecified leg, initial encounter: Secondary | ICD-10-CM | POA: Insufficient documentation

## 2024-08-17 NOTE — Progress Notes (Signed)
 "  Acute Office Visit  Subjective:     Patient ID: Sarah Harrington, female    DOB: 1965-11-09, 59 y.o.   MRN: [id number]  Chief Complaint  Patient presents with   Knee Pain    Onset 2 months pain starts in back of Rt knee that travels to knee. Having problems bending the knee.     HPI .Discussed the use of AI scribe software for clinical note transcription with the patient, who gave verbal consent to proceed.  History of Present Illness Sarah Harrington is a 59 year old female with a history of blood clots who presents with right posterior lateral calf pain.   Right calf and knee pain - Throbbing pain in the posterior lateral right calf for two months, particularly at night when lying down - Pain affects ability to walk normally; gait is altered and has drawn attention from others - Pain rated as 8/10 for discomfort, 2-3/10 for pain - No tenderness to touch - No associated trauma or sudden onset - No warmth or swelling in the area - No pain in the Achilles tendon - Pain does not worsen with standing on tiptoes - Unable to bend back without discomfort - Continues to walk dogs daily despite discomfort  Anticoagulation therapy and history of blood clots - History of blood clots - Currently taking warfarin; has not missed any doses - No blood clots while on warfarin - Pain is not similar to previous shooting pain experienced with blood clots  Symptom management - Tried stretching and Tylenol without relief    ROS See HPI.      Objective:    BP 128/70   Pulse (!) 52   Ht 5' 6 (1.676 m)   Wt 192 lb 12 oz (87.4 kg)   LMP 08/16/2014   SpO2 100%   BMI 31.11 kg/m  BP Readings from Last 3 Encounters:  08/17/24 128/70  11/15/23 118/70  10/14/23 124/78   Wt Readings from Last 3 Encounters:  08/17/24 192 lb 12 oz (87.4 kg)  11/15/23 199 lb (90.3 kg)  09/20/22 181 lb (82.1 kg)      Physical Exam HENT:     Head: Normocephalic.  Pulmonary:     Effort:  Pulmonary effort is normal.  Musculoskeletal:     Comments: NROM of right knee No bruising, swelling, warmth, redness over anterior or posterior knee.  Noted varicose veins but not where patient is hurting.  No really pain to palpation other than some tenderness over the posterior lateral calf at insertion to posterior knee.  5/5 strength of right leg.    Neurological:     Mental Status: She is alert.          Assessment & Plan:  .Sarah Harrington was seen today for knee pain.  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  - Referred to Dr. Brayton for further evaluation and possible ultrasound or MRI. - Provided targeted exercises for gastrocnemius insertion. - Advised icing the area before bed to reduce inflammation. - Recommended wearing stable shoes. - Suggested using IcyHot patches or biofreeze gel for pain relief. Patient cannot take NSAIds due to being on warfarin.   History of venous thromboembolism on anticoagulation Venous thromboembolism managed with warfarin. Current calf pain not  consistent with thromboembolism. - Continue warfarin therapy.     Return if symptoms worsen or fail to improve.  Clovis Mankins, PA-C   "

## 2024-08-17 NOTE — Patient Instructions (Addendum)
 Icy hot/icing  Medial Head Gastrocnemius Tear Rehab Ask your health care provider which exercises are safe for you. Do exercises exactly as told by your health care provider and adjust them as directed. It is normal to feel mild stretching, pulling, tightness, or discomfort as you do these exercises. Stop right away if you feel sudden pain or your pain gets worse. Do not begin these exercises until told by your health care provider. Stretching and range-of-motion exercises These exercises warm up your muscles and joints and improve the movement and flexibility of your lower leg. These exercises also help to relieve pain and stiffness. Gastrocnemius stretch This exercise is also called a calf stretch. It stretches the muscles in the back of the lower leg (gastrocnemius). Sit with your left / right leg extended. Loop a belt or towel around the ball of your left / right foot. The ball of your foot is on the walking surface, right under your toes. Hold both ends of the belt or towel. Keep your left / right ankle and foot relaxed and keep your knee straight while you use the belt or towel to pull your foot and ankle toward you. Stop at the first point of resistance. Hold this position for __________ seconds. Repeat __________ times. Complete this exercise __________ times a day. Ankle alphabet  Sit with your left / right leg supported at the lower leg. Do not rest your foot on anything. Make sure your foot has room to move freely. Think of your left / right foot as a paintbrush. Move your foot to trace each letter of the alphabet in the air. Keep your hip and knee still while you trace. Make the letters as large as you can without feeling discomfort. Trace every letter of the alphabet. Repeat __________ times. Complete this exercise __________ times a day. Strengthening exercises These exercises build strength and endurance in your lower leg. Endurance is the ability to use your muscles for a  long time, even after they get tired. Plantar flexion with band, seated  Sit on the floor with your left / right leg extended. Loop a rubber exercise band or tube around the ball of your left / right foot. The ball of your foot is on the walking surface, right under your toes. The band or tube should be slightly tense when your foot is relaxed. If the band or tube slips, you can put on your shoe or put a washcloth between the band and your foot to help it stay in place. While holding both ends of the band or tube, slowly point your toes downward, pushing them away from you (plantar flexion). Hold this position for __________ seconds. Slowly release the tension in the band or tube, controlling smoothly until your foot is back to the starting position. Repeat __________ times. Complete this exercise __________ times a day. Plantar flexion, standing  Stand with your feet shoulder-width apart. Place your hands on a wall or table to steady yourself as needed, but try not to use it for support. Rise up on your toes (plantar flexion). If this exercise is too easy, try these options: Shift your weight toward your left / right leg until you feel challenged. If told by your health care provider, stand on your left / right foot only. Hold this position for __________ seconds. Repeat __________ times. Complete this exercise __________ times a day. Eccentric plantar flexion  Stand on the balls of your feet on the edge of a step. The ball of  your foot is on the walking surface, right under your toes. Do not put your heels on the step. For balance, rest your hands on the wall or on a railing. Try not to lean on it for support. Rise up onto the balls of your feet, using both legs to help. Keeping your heels up, slowly shift all of your weight to your left / right foot and lift your other foot off the step. Slowly lower your left / right heel so it drops below the level of the step. Lowering your heel under  tension is called eccentric plantar flexion. You will feel a slight stretch in your left / right calf. Put your other foot back onto the step before returning to the start position. Repeat __________ times. Complete this exercise __________ times a day. This information is not intended to replace advice given to you by your health care provider. Make sure you discuss any questions you have with your health care provider. Document Revised: 12/23/2020 Document Reviewed: 12/23/2020 Elsevier Patient Education  2024 [employer].
